# Patient Record
Sex: Male | Born: 1935 | Race: White | Hispanic: No | Marital: Single | Smoking: Never smoker
Health system: Southern US, Community
[De-identification: ages and names within clinical notes are randomized; demographics above are authoritative.]

## PROBLEM LIST (undated history)

## (undated) DIAGNOSIS — I480 Paroxysmal atrial fibrillation: Secondary | ICD-10-CM

## (undated) DIAGNOSIS — R9439 Abnormal result of other cardiovascular function study: Secondary | ICD-10-CM

## (undated) DIAGNOSIS — I5042 Chronic combined systolic (congestive) and diastolic (congestive) heart failure: Secondary | ICD-10-CM

## (undated) DIAGNOSIS — N186 End stage renal disease: Secondary | ICD-10-CM

## (undated) DIAGNOSIS — I272 Pulmonary hypertension, unspecified: Secondary | ICD-10-CM

## (undated) DIAGNOSIS — D649 Anemia, unspecified: Secondary | ICD-10-CM

## (undated) DIAGNOSIS — I4892 Unspecified atrial flutter: Secondary | ICD-10-CM

## (undated) DIAGNOSIS — J9610 Chronic respiratory failure, unspecified whether with hypoxia or hypercapnia: Secondary | ICD-10-CM

## (undated) DIAGNOSIS — R55 Syncope and collapse: Secondary | ICD-10-CM

## (undated) DIAGNOSIS — I10 Essential (primary) hypertension: Secondary | ICD-10-CM

## (undated) HISTORY — PX: TONSILLECTOMY: SUR1361

---

## 2015-12-24 DIAGNOSIS — E785 Hyperlipidemia, unspecified: Secondary | ICD-10-CM | POA: Diagnosis present

## 2015-12-24 DIAGNOSIS — I480 Paroxysmal atrial fibrillation: Secondary | ICD-10-CM | POA: Diagnosis present

## 2015-12-24 DIAGNOSIS — N189 Chronic kidney disease, unspecified: Secondary | ICD-10-CM

## 2015-12-24 DIAGNOSIS — N185 Chronic kidney disease, stage 5: Secondary | ICD-10-CM | POA: Diagnosis present

## 2015-12-24 DIAGNOSIS — I10 Essential (primary) hypertension: Secondary | ICD-10-CM | POA: Diagnosis present

## 2015-12-24 DIAGNOSIS — Z87898 Personal history of other specified conditions: Secondary | ICD-10-CM

## 2015-12-24 DIAGNOSIS — D631 Anemia in chronic kidney disease: Secondary | ICD-10-CM | POA: Diagnosis present

## 2016-01-23 ENCOUNTER — Inpatient Hospital Stay (HOSPITAL_COMMUNITY)
Admission: AD | Admit: 2016-01-23 | Discharge: 2016-01-30 | DRG: 291 | Disposition: A | Discharge status: Skilled Nursing Facility | Payer: Medicare Other | Source: Other Acute Inpatient Hospital | Attending: Family Medicine | Admitting: Family Medicine

## 2016-01-23 ENCOUNTER — Encounter (HOSPITAL_COMMUNITY): Payer: Self-pay | Admitting: *Deleted

## 2016-01-23 DIAGNOSIS — N185 Chronic kidney disease, stage 5: Secondary | ICD-10-CM | POA: Diagnosis not present

## 2016-01-23 DIAGNOSIS — G9341 Metabolic encephalopathy: Secondary | ICD-10-CM | POA: Diagnosis not present

## 2016-01-23 DIAGNOSIS — N2581 Secondary hyperparathyroidism of renal origin: Secondary | ICD-10-CM | POA: Diagnosis present

## 2016-01-23 DIAGNOSIS — F0390 Unspecified dementia without behavioral disturbance: Secondary | ICD-10-CM | POA: Diagnosis present

## 2016-01-23 DIAGNOSIS — J9600 Acute respiratory failure, unspecified whether with hypoxia or hypercapnia: Secondary | ICD-10-CM

## 2016-01-23 DIAGNOSIS — I509 Heart failure, unspecified: Secondary | ICD-10-CM | POA: Diagnosis not present

## 2016-01-23 DIAGNOSIS — Z7901 Long term (current) use of anticoagulants: Secondary | ICD-10-CM

## 2016-01-23 DIAGNOSIS — I4892 Unspecified atrial flutter: Secondary | ICD-10-CM | POA: Diagnosis present

## 2016-01-23 DIAGNOSIS — Z87898 Personal history of other specified conditions: Secondary | ICD-10-CM

## 2016-01-23 DIAGNOSIS — E875 Hyperkalemia: Secondary | ICD-10-CM | POA: Diagnosis not present

## 2016-01-23 DIAGNOSIS — Z823 Family history of stroke: Secondary | ICD-10-CM

## 2016-01-23 DIAGNOSIS — N189 Chronic kidney disease, unspecified: Secondary | ICD-10-CM | POA: Diagnosis not present

## 2016-01-23 DIAGNOSIS — I10 Essential (primary) hypertension: Secondary | ICD-10-CM | POA: Diagnosis not present

## 2016-01-23 DIAGNOSIS — I132 Hypertensive heart and chronic kidney disease with heart failure and with stage 5 chronic kidney disease, or end stage renal disease: Secondary | ICD-10-CM | POA: Diagnosis present

## 2016-01-23 DIAGNOSIS — Z992 Dependence on renal dialysis: Secondary | ICD-10-CM | POA: Diagnosis not present

## 2016-01-23 DIAGNOSIS — M25519 Pain in unspecified shoulder: Secondary | ICD-10-CM

## 2016-01-23 DIAGNOSIS — I447 Left bundle-branch block, unspecified: Secondary | ICD-10-CM | POA: Diagnosis not present

## 2016-01-23 DIAGNOSIS — I429 Cardiomyopathy, unspecified: Secondary | ICD-10-CM | POA: Diagnosis present

## 2016-01-23 DIAGNOSIS — J45909 Unspecified asthma, uncomplicated: Secondary | ICD-10-CM | POA: Diagnosis present

## 2016-01-23 DIAGNOSIS — R339 Retention of urine, unspecified: Secondary | ICD-10-CM | POA: Diagnosis not present

## 2016-01-23 DIAGNOSIS — I5022 Chronic systolic (congestive) heart failure: Secondary | ICD-10-CM | POA: Diagnosis not present

## 2016-01-23 DIAGNOSIS — Z8249 Family history of ischemic heart disease and other diseases of the circulatory system: Secondary | ICD-10-CM

## 2016-01-23 DIAGNOSIS — E440 Moderate protein-calorie malnutrition: Secondary | ICD-10-CM | POA: Diagnosis present

## 2016-01-23 DIAGNOSIS — J9601 Acute respiratory failure with hypoxia: Secondary | ICD-10-CM | POA: Diagnosis not present

## 2016-01-23 DIAGNOSIS — I5021 Acute systolic (congestive) heart failure: Secondary | ICD-10-CM

## 2016-01-23 DIAGNOSIS — E785 Hyperlipidemia, unspecified: Secondary | ICD-10-CM | POA: Diagnosis present

## 2016-01-23 DIAGNOSIS — I272 Other secondary pulmonary hypertension: Secondary | ICD-10-CM | POA: Diagnosis not present

## 2016-01-23 DIAGNOSIS — N186 End stage renal disease: Secondary | ICD-10-CM | POA: Diagnosis present

## 2016-01-23 DIAGNOSIS — G4700 Insomnia, unspecified: Secondary | ICD-10-CM | POA: Diagnosis not present

## 2016-01-23 DIAGNOSIS — Z7952 Long term (current) use of systemic steroids: Secondary | ICD-10-CM

## 2016-01-23 DIAGNOSIS — J189 Pneumonia, unspecified organism: Secondary | ICD-10-CM | POA: Diagnosis present

## 2016-01-23 DIAGNOSIS — K5909 Other constipation: Secondary | ICD-10-CM | POA: Diagnosis present

## 2016-01-23 DIAGNOSIS — D631 Anemia in chronic kidney disease: Secondary | ICD-10-CM | POA: Diagnosis not present

## 2016-01-23 DIAGNOSIS — I480 Paroxysmal atrial fibrillation: Secondary | ICD-10-CM | POA: Diagnosis present

## 2016-01-23 DIAGNOSIS — I481 Persistent atrial fibrillation: Secondary | ICD-10-CM | POA: Diagnosis not present

## 2016-01-23 DIAGNOSIS — Z9189 Other specified personal risk factors, not elsewhere classified: Secondary | ICD-10-CM

## 2016-01-23 DIAGNOSIS — I482 Chronic atrial fibrillation: Secondary | ICD-10-CM | POA: Diagnosis not present

## 2016-01-23 DIAGNOSIS — I4891 Unspecified atrial fibrillation: Secondary | ICD-10-CM | POA: Diagnosis present

## 2016-01-23 HISTORY — DX: Abnormal result of other cardiovascular function study: R94.39

## 2016-01-23 HISTORY — DX: Essential (primary) hypertension: I10

## 2016-01-23 HISTORY — DX: Anemia, unspecified: D64.9

## 2016-01-23 HISTORY — DX: Unspecified atrial flutter: I48.92

## 2016-01-23 HISTORY — DX: Syncope and collapse: R55

## 2016-01-23 HISTORY — DX: Chronic respiratory failure, unspecified whether with hypoxia or hypercapnia: J96.10

## 2016-01-23 HISTORY — DX: Chronic combined systolic (congestive) and diastolic (congestive) heart failure: I50.42

## 2016-01-23 HISTORY — DX: Paroxysmal atrial fibrillation: I48.0

## 2016-01-23 HISTORY — DX: End stage renal disease: N18.6

## 2016-01-23 HISTORY — DX: Pulmonary hypertension, unspecified: I27.20

## 2016-01-23 LAB — BLOOD GAS, ARTERIAL
Acid-Base Excess: 0.9 mmol/L (ref 0.0–2.0)
BICARBONATE: 24.9 mmol/L (ref 20.0–28.0)
DRAWN BY: 330991
FIO2: 21
O2 Saturation: 86 %
PATIENT TEMPERATURE: 97.7
PH ART: 7.424 (ref 7.350–7.450)
pCO2 arterial: 38.5 mmHg (ref 32.0–48.0)
pO2, Arterial: 49.3 mmHg — ABNORMAL LOW (ref 83.0–108.0)

## 2016-01-23 LAB — CBC
HEMATOCRIT: 29.3 % — AB (ref 39.0–52.0)
Hemoglobin: 9 g/dL — ABNORMAL LOW (ref 13.0–17.0)
MCH: 31.8 pg (ref 26.0–34.0)
MCHC: 30.7 g/dL (ref 30.0–36.0)
MCV: 103.5 fL — AB (ref 78.0–100.0)
Platelets: 211 10*3/uL (ref 150–400)
RBC: 2.83 MIL/uL — ABNORMAL LOW (ref 4.22–5.81)
RDW: 17.2 % — AB (ref 11.5–15.5)
WBC: 7.3 10*3/uL (ref 4.0–10.5)

## 2016-01-23 LAB — RENAL FUNCTION PANEL
ALBUMIN: 2.6 g/dL — AB (ref 3.5–5.0)
Anion gap: 3 — ABNORMAL LOW (ref 5–15)
BUN: 38 mg/dL — AB (ref 6–20)
CO2: 26 mmol/L (ref 22–32)
CREATININE: 4.04 mg/dL — AB (ref 0.61–1.24)
Calcium: 8.4 mg/dL — ABNORMAL LOW (ref 8.9–10.3)
Chloride: 108 mmol/L (ref 101–111)
GFR calc Af Amer: 14 mL/min — ABNORMAL LOW (ref >60)
GFR calc non Af Amer: 12 mL/min — ABNORMAL LOW (ref >60)
GLUCOSE: 110 mg/dL — AB (ref 65–99)
PHOSPHORUS: 2.4 mg/dL — AB (ref 2.5–4.6)
POTASSIUM: 5 mmol/L (ref 3.5–5.1)
SODIUM: 137 mmol/L (ref 135–145)

## 2016-01-23 LAB — TROPONIN I: TROPONIN I: 0.03 ng/mL — AB (ref <0.03)

## 2016-01-23 MED ORDER — VANCOMYCIN HCL 10 G IV SOLR
1500.0000 mg | Freq: Once | INTRAVENOUS | Status: AC
  Administered 2016-01-24: 1500 mg via INTRAVENOUS
  Filled 2016-01-23: qty 1500

## 2016-01-23 MED ORDER — TAMSULOSIN HCL 0.4 MG PO CAPS
0.4000 mg | ORAL_CAPSULE | Freq: Every day | ORAL | Status: DC
  Administered 2016-01-24 – 2016-01-29 (×7): 0.4 mg via ORAL
  Filled 2016-01-23 (×7): qty 1

## 2016-01-23 MED ORDER — FERROUS SULFATE 325 (65 FE) MG PO TABS
325.0000 mg | ORAL_TABLET | Freq: Every day | ORAL | Status: DC
  Administered 2016-01-24 – 2016-01-30 (×7): 325 mg via ORAL
  Filled 2016-01-23 (×7): qty 1

## 2016-01-23 MED ORDER — IPRATROPIUM-ALBUTEROL 0.5-2.5 (3) MG/3ML IN SOLN
3.0000 mL | Freq: Four times a day (QID) | RESPIRATORY_TRACT | Status: DC
  Administered 2016-01-23 – 2016-01-24 (×4): 3 mL via RESPIRATORY_TRACT
  Filled 2016-01-23 (×4): qty 3

## 2016-01-23 MED ORDER — DEXTROSE 5 % IV SOLN
1.0000 g | INTRAVENOUS | Status: DC
  Administered 2016-01-23: 1 g via INTRAVENOUS
  Filled 2016-01-23 (×2): qty 1

## 2016-01-23 MED ORDER — FLEET ENEMA 7-19 GM/118ML RE ENEM
1.0000 | ENEMA | Freq: Once | RECTAL | Status: DC | PRN
  Filled 2016-01-23: qty 1

## 2016-01-23 MED ORDER — ACETAMINOPHEN 325 MG PO TABS
650.0000 mg | ORAL_TABLET | Freq: Four times a day (QID) | ORAL | Status: DC | PRN
  Administered 2016-01-29: 650 mg via ORAL
  Filled 2016-01-23: qty 2

## 2016-01-23 MED ORDER — VANCOMYCIN HCL IN DEXTROSE 1-5 GM/200ML-% IV SOLN
INTRAVENOUS | Status: AC
  Filled 2016-01-23: qty 200

## 2016-01-23 MED ORDER — VANCOMYCIN HCL IN DEXTROSE 500-5 MG/100ML-% IV SOLN
INTRAVENOUS | Status: AC
  Administered 2016-01-24: 500 mg
  Filled 2016-01-23: qty 100

## 2016-01-23 MED ORDER — DEXTROSE 5 % IV SOLN
1.0000 g | Freq: Three times a day (TID) | INTRAVENOUS | Status: DC
  Filled 2016-01-23 (×3): qty 1

## 2016-01-23 MED ORDER — HEPARIN SODIUM (PORCINE) 1000 UNIT/ML DIALYSIS: 20.0000 [IU]/kg | INTRAMUSCULAR | Status: DC | PRN

## 2016-01-23 MED ORDER — MAGNESIUM HYDROXIDE 400 MG/5ML PO SUSP: 30.0000 mL | Freq: Once | ORAL | Status: DC | PRN

## 2016-01-23 MED ORDER — OXYCODONE HCL 5 MG PO TABS
5.0000 mg | ORAL_TABLET | ORAL | Status: DC | PRN
  Administered 2016-01-25: 5 mg via ORAL
  Filled 2016-01-23: qty 1

## 2016-01-23 MED ORDER — AMIODARONE HCL 200 MG PO TABS
400.0000 mg | ORAL_TABLET | Freq: Every day | ORAL | Status: DC
  Administered 2016-01-24 – 2016-01-26 (×4): 400 mg via ORAL
  Filled 2016-01-23 (×4): qty 2

## 2016-01-23 MED ORDER — MIRTAZAPINE 15 MG PO TABS
30.0000 mg | ORAL_TABLET | Freq: Every day | ORAL | Status: DC
  Administered 2016-01-24 – 2016-01-29 (×7): 30 mg via ORAL
  Filled 2016-01-23 (×7): qty 2

## 2016-01-23 MED ORDER — ATORVASTATIN CALCIUM 10 MG PO TABS
10.0000 mg | ORAL_TABLET | Freq: Every day | ORAL | Status: DC
  Administered 2016-01-23 – 2016-01-29 (×5): 10 mg via ORAL
  Filled 2016-01-23 (×5): qty 1

## 2016-01-23 MED ORDER — APIXABAN 2.5 MG PO TABS
2.5000 mg | ORAL_TABLET | Freq: Two times a day (BID) | ORAL | Status: DC
  Administered 2016-01-24 – 2016-01-30 (×14): 2.5 mg via ORAL
  Filled 2016-01-23 (×14): qty 1

## 2016-01-23 MED ORDER — PROMETHAZINE HCL 25 MG PO TABS
12.5000 mg | ORAL_TABLET | Freq: Four times a day (QID) | ORAL | Status: DC | PRN
  Administered 2016-01-25: 12.5 mg via ORAL
  Filled 2016-01-23: qty 1

## 2016-01-23 NOTE — Consult Note (Signed)
[facility] Renal Consultation Note    Indication for Consultation:  Management of ESRD/hemodialysis; anemia, hypertension/volume and secondary hyperparathyroidism  HPI: Jamie HuxleyDal Stegner is a 82 y.o. male.   Jamie Brock is an elderly WM with PMH sig for paroxysmal A fib/flutter, HTN, CHF, and new ESRD (recently started on HD 01/10/16 [facility]) who was discharged from an outside hospital on 01/21/16 and sent from [facility] after he developed SOB and found to have hypoxia.  He was then [facility] despite [facility] all of [facility] for further evaluation.  We were consulted to help manage his ESRD/hemodialysis as well as renal-related medical conditions.  Of note, he was [facility].  He is a poor historian and did not know the name of his dialysis unit.  Past Medical History:  Diagnosis Date  . CKD (chronic kidney disease) stage 5, GFR less than 15 ml/min (HCC) 12/24/2015  . Hypertension   . Shortness of breath dyspnea    Past Surgical History:  Procedure Laterality Date  . TONSILLECTOMY     Family History:   Family History  Problem Relation Age of Onset  . Stroke Mother   . Heart attack Father   . Suicidality Brother    Social History:  reports that he has never smoked. He has never used smokeless tobacco. He reports that he does not drink alcohol or use drugs. No Known Allergies Prior to Admission medications   Medication Sig Start Date End Date Taking? Authorizing Provider  acetaminophen (TYLENOL) 325 MG tablet Take 650 mg by mouth every 6 (six) hours as needed for fever (pain).   Yes Historical Provider, MD  albuterol (PROVENTIL) (2.5 MG/3ML) 0.083% nebulizer solution Take 2.5 mg by nebulization every 4 (four) hours as needed for shortness of breath.   Yes Historical Provider, MD  amiodarone (PACERONE) 400 MG tablet Take 400 mg by mouth daily.    Yes Historical Provider, MD  apixaban (ELIQUIS) 2.5 MG TABS tablet Take 2.5 mg by mouth 2 (two) times daily.   Yes Historical Provider, MD  atorvastatin (LIPITOR) 10 MG tablet Take 10 mg by mouth daily.   Yes Historical Provider, MD  bisacodyl (DULCOLAX) 10 MG suppository Place 10 mg rectally once as needed (constipation (if no BM from MOM/lax)).   Yes Historical Provider, MD  cyanocobalamin 500 MCG tablet Take 1,000 mcg by mouth daily.   Yes Historical Provider, MD  diphenhydrAMINE (BENADRYL) 25 MG tablet Take 25 mg by mouth every 4 (four) hours as needed for itching, allergies or sleep.   Yes Historical Provider, MD  ferrous sulfate 325 (65 FE) MG tablet Take 325 mg by mouth daily.   Yes Historical Provider, MD  ipratropium-albuterol (DUONEB) 0.5-2.5 (3) MG/3ML SOLN Take 3 mLs by nebulization every 6 (six) hours.   Yes Historical Provider, MD  magnesium hydroxide (MILK OF MAGNESIA) 400 MG/5ML suspension Take 30 mLs by mouth once as needed (constipation (if no BM on day 3)).   Yes Historical Provider, MD  metoprolol tartrate (LOPRESSOR) 25 MG tablet Take 25 mg by mouth 3 (three) times daily. 8am, 2pm, 8pm   Yes Historical Provider, MD  mirtazapine (REMERON) 30 MG tablet Take 30 mg by mouth at bedtime.   Yes Historical Provider, MD  predniSONE (DELTASONE) 20 MG tablet Take 10-20 mg by mouth See admin instructions. Started 01/22/16: take 1 tablet (20 mg) by mouth  daily for 3 days, then take 1/2 tablet (10 mg) daily for 3 days, then stop   Yes Historical Provider, MD  sodium phosphate (FLEET) 7-19 GM/118ML ENEM Place 1 enema rectally once as needed for severe constipation (constipation (if no result from bisacodyl suppository)).   Yes Historical Provider, MD  tamsulosin (FLOMAX) 0.4 MG CAPS capsule Take 0.4 mg by mouth at bedtime.   Yes Historical Provider, MD   No current facility-administered medications for this encounter.    Labs: Basic Metabolic Panel: No results for input(s): NA, K, CL, CO2,  GLUCOSE, BUN, CREATININE, CALCIUM, PHOS in the last 168 hours.  Invalid input(s): ALB Liver Function Tests: No results for input(s): AST, ALT, ALKPHOS, BILITOT, PROT, ALBUMIN in the last 168 hours. No results for input(s): LIPASE, AMYLASE in the last 168 hours. No results for input(s): AMMONIA in the last 168 hours. CBC: No results for input(s): WBC, NEUTROABS, HGB, HCT, MCV, PLT in the last 168 hours. Cardiac Enzymes: No results for input(s): CKTOTAL, CKMB, CKMBINDEX, TROPONINI in the last 168 hours. CBG: No results for input(s): GLUCAP in the last 168 hours. Iron Studies: No results for input(s): IRON, TIBC, TRANSFERRIN, FERRITIN in the last 72 hours. Studies/Results: No results found.  ROS: Review of systems not obtained due to patient factors. Physical Exam: Vitals:   01/23/16 1150  BP: (!) 107/58  Pulse: (!) 113  Resp: 18  Temp: 97.7 F (36.5 C)  TempSrc: Oral  SpO2: 96%      Weight change:  No intake or output data in the 24 hours ending 01/23/16 1420 BP (!) 107/58 (BP Location: Right Arm)   Pulse (!) 113   Temp 97.7 F (36.5 C) (Oral)   Resp 18   SpO2 96%  General appearance: slowed mentation and oriented to person and time only Head: Normocephalic, without obvious abnormality, atraumatic Eyes: negative findings: lids and lashes normal, conjunctivae and sclerae normal and corneas clear Resp: rales bibasilar Cardio: irregularly irregular rhythm and no rub GI: soft, non-tender; bowel sounds normal; no masses,  no organomegaly Extremities: edema minimal pretibial edema Dialysis Access:  Dialysis Orders: [facility]  on [facility] . Primary Nephrologist:  Dr. Lois Brock  Assessment/Plan: 1.  Hypoxia- with some pulmonary edema vs. Pna.  Plan for HD with UF today and follow clinically 2.  ESRD -  Normally TTS but new start to HD 3.  Hypertension/volume  - some volume overload as above, will UF and follow 4.  Anemia  - will follow H/H and start  ESA 5.  Metabolic bone disease -  Await calcium and phos levels 6.  Nutrition - renal diet 7. Hyperkalemia- K of 5.6 at [facility] will recheck and plan for HD today. 8. A fib- on amiodarone and dilt for rate control as well as Eliquis, per primary  Irena Cords, MD [facility], [facility] Pager [phone] 01/23/2016, 2:20 PM

## 2016-01-23 NOTE — Progress Notes (Signed)
Pharmacy Antibiotic Note Lois HuxleyDal Barberi is a 82 y.o. male admitted on 01/23/2016 with concern for  pneumonia in setting of ESRD (normally TTS).  Pharmacy has been consulted for vancomycin and cefepime dosing.  Plan: 1. Vancomycin 1500 mg x 1 now, follow up IHD schedule and doses further doses as needed  2. Cefepime 1 gram IV every 24 hours for now   Temp (24hrs), Avg:97.8 F (36.6 C), Min:97.7 F (36.5 C), Max:97.8 F (36.6 C)  No results for input(s): WBC, CREATININE, LATICACIDVEN, VANCOTROUGH, VANCOPEAK, VANCORANDOM, GENTTROUGH, GENTPEAK, GENTRANDOM, TOBRATROUGH, TOBRAPEAK, TOBRARND, AMIKACINPEAK, AMIKACINTROU, AMIKACIN in the last 168 hours.  CrCl cannot be calculated (Unknown ideal weight.).    No Known Allergies  Antimicrobials this admission: 9/4 Cefepime >>  9/4 vancomycin  >>   Dose adjustments this admission: n/a  Microbiology results: 9/4 BCx:  9/4 UCx:     Thank you for allowing pharmacy to be a part of this patient's care.  Pollyann SamplesAndy Emarion Toral, PharmD, BCPS 01/23/2016, 5:47 PM Pager: [phone]

## 2016-01-23 NOTE — Progress Notes (Signed)
CRITICAL LAB VALUE: TROPONIN: 0.03 On call notified.  Veatrice KellsMahmoud,Amiera Herzberg I, RN

## 2016-01-23 NOTE — H&P (Addendum)
History and Physical    Jamie HuxleyDal Matos WUJ:[id number] DOB: Apr 04, 1930 DOA: 01/23/2016  PCP: No primary care provider on file. [facility]  Chief Complaint: AMS and cough  HPI: Jamie Brock is a 82 y.o. male with medical history significant of ESRD, HTN, A. fib, constipation.  Level V caveat applies this patient is unable to provide any reliable history at this point time. Difficult to ascertain whether not this is patient's baseline. [facility]. Information obtained by [facility]. Patient was recently started on hemodialysis, 01/10/2016, [facility]. He was discharged [facility] on 01/21/2016. On 01/23/2016 nursing home staff noted that patient was small-to-moderate and short of breath. EMS arrived and noted in oxygen l saturation level of around 80%. The reported home O2 need. No other focal abnormality reported and no complaints such as fevers, chest pain, shortness of breath. When asked to describe symptoms patient is only able to endorse having a "cold" for several weeks. Patient has no clue as to when his last dialysis session was. Workup around [facility] and fluid overload in a dialysis patient. No dialysis capability around [facility] so patient was accepted to [facility].  ED Course: Pt admitted [facility] the [facility]  Review of Systems: As per HPI otherwise 10 point review of systems negative.   Ambulatory Status: unknown  Past Medical History:  Diagnosis Date  . CKD (chronic kidney disease) stage 5, GFR less than 15 ml/min (HCC) 12/24/2015  . Hypertension   . Shortness of breath dyspnea     Past Surgical History:  Procedure Laterality Date  . TONSILLECTOMY      Social History   Social History  . Marital status: Single    Spouse name: N/A  . Number of children: N/A    . Years of education: N/A   Occupational History  . Not on file.   Social History Main Topics  . Smoking status: Never Smoker  . Smokeless tobacco: Never Used  . Alcohol use No  . Drug use: No  . Sexual activity: No   Other Topics Concern  . Not on file   Social History Narrative  . No narrative on file    No Known Allergies  Family History  Problem Relation Age of Onset  . Stroke Mother   . Heart attack Father   . Suicidality Brother     Prior to Admission medications   Medication Sig Start Date End Date Taking? Authorizing Provider  acetaminophen (TYLENOL) 325 MG tablet Take 650 mg by mouth every 6 (six) hours as needed for fever (pain).   Yes Historical Provider, MD  albuterol (PROVENTIL) (2.5 MG/3ML) 0.083% nebulizer solution Take 2.5 mg by nebulization every 4 (four) hours as needed for shortness of breath.   Yes Historical Provider, MD  amiodarone (PACERONE) 400 MG tablet Take 400 mg by mouth daily.   Yes Historical Provider, MD  apixaban (ELIQUIS) 2.5 MG TABS tablet Take 2.5 mg by mouth 2 (two) times daily.   Yes Historical Provider, MD  atorvastatin (LIPITOR) 10 MG tablet Take 10 mg by mouth daily.   Yes Historical Provider, MD  bisacodyl (DULCOLAX) 10 MG suppository Place 10 mg rectally once as needed (constipation (if no BM from MOM/lax)).   Yes Historical Provider, MD  cyanocobalamin 500 MCG tablet Take 1,000 mcg by mouth daily.  Yes Historical Provider, MD  diphenhydrAMINE (BENADRYL) 25 MG tablet Take 25 mg by mouth every 4 (four) hours as needed for itching, allergies or sleep.   Yes Historical Provider, MD  ferrous sulfate 325 (65 FE) MG tablet Take 325 mg by mouth daily.   Yes Historical Provider, MD  ipratropium-albuterol (DUONEB) 0.5-2.5 (3) MG/3ML SOLN Take 3 mLs by nebulization every 6 (six) hours.   Yes Historical Provider, MD  magnesium hydroxide (MILK OF MAGNESIA) 400 MG/5ML suspension Take 30 mLs by mouth once as needed (constipation (if no BM on day  3)).   Yes Historical Provider, MD  metoprolol tartrate (LOPRESSOR) 25 MG tablet Take 25 mg by mouth 3 (three) times daily. 8am, 2pm, 8pm   Yes Historical Provider, MD  mirtazapine (REMERON) 30 MG tablet Take 30 mg by mouth at bedtime.   Yes Historical Provider, MD  predniSONE (DELTASONE) 20 MG tablet Take 10-20 mg by mouth See admin instructions. Started 01/22/16: take 1 tablet (20 mg) by mouth daily for 3 days, then take 1/2 tablet (10 mg) daily for 3 days, then stop   Yes Historical Provider, MD  sodium phosphate (FLEET) 7-19 GM/118ML ENEM Place 1 enema rectally once as needed for severe constipation (constipation (if no result from bisacodyl suppository)).   Yes Historical Provider, MD  tamsulosin (FLOMAX) 0.4 MG CAPS capsule Take 0.4 mg by mouth at bedtime.   Yes Historical Provider, MD    Physical Exam: Vitals:   01/23/16 1150  BP: (!) 107/58  Pulse: (!) 113  Resp: 18  Temp: 97.7 F (36.5 C)  TempSrc: Oral  SpO2: 96%     General:  Appears calm and comfortable Eyes:  PERRL, EOMI, normal lids, iris ENT: Dry mucous membranes, no teeth Neck:  no LAD, masses or thyromegaly Cardiovascular:  Irregularly irregular, 3/6 systolic murmur, trace lower extremity pitting edema bilaterally Respiratory: Diminished breath sounds in the bases, mild increased effort, crackles bilaterally Abdomen:  soft, ntnd, NABS Skin: Right upper chest wall with debris hemodialysis catheter present. OpSite in place. no rash or induration seen on limited exam Musculoskeletal:  grossly normal tone BUE/BLE, good ROM, no bony abnormality Psychiatric: Follows basic commands. Pleasant. Attempts to answer questions in appropriate manner but patient refused. Neurologic:  CN 2-12 grossly intact, moves all extremities in coordinated fashion, sensation intact  Labs on Admission: I have personally reviewed following labs and imaging studies  CBC: No results for input(s): WBC, NEUTROABS, HGB, HCT, MCV, PLT in the last 168  hours. Basic Metabolic Panel: No results for input(s): NA, K, CL, CO2, GLUCOSE, BUN, CREATININE, CALCIUM, MG, PHOS in the last 168 hours. GFR: CrCl cannot be calculated (Unknown ideal weight.). Liver Function Tests: No results for input(s): AST, ALT, ALKPHOS, BILITOT, PROT, ALBUMIN in the last 168 hours. No results for input(s): LIPASE, AMYLASE in the last 168 hours. No results for input(s): AMMONIA in the last 168 hours. Coagulation Profile: No results for input(s): INR, PROTIME in the last 168 hours. Cardiac Enzymes: No results for input(s): CKTOTAL, CKMB, CKMBINDEX, TROPONINI in the last 168 hours. BNP (last 3 results) No results for input(s): PROBNP in the last 8760 hours. HbA1C: No results for input(s): HGBA1C in the last 72 hours. CBG: No results for input(s): GLUCAP in the last 168 hours. Lipid Profile: No results for input(s): CHOL, HDL, LDLCALC, TRIG, CHOLHDL, LDLDIRECT in the last 72 hours. Thyroid Function Tests: No results for input(s): TSH, T4TOTAL, FREET4, T3FREE, THYROIDAB in the last 72 hours. Anemia Panel: No results  for input(s): VITAMINB12, FOLATE, FERRITIN, TIBC, IRON, RETICCTPCT in the last 72 hours. Urine analysis: No results found for: COLORURINE, APPEARANCEUR, LABSPEC, PHURINE, GLUCOSEU, HGBUR, BILIRUBINUR, KETONESUR, PROTEINUR, UROBILINOGEN, NITRITE, LEUKOCYTESUR  Creatinine Clearance: CrCl cannot be calculated (Unknown ideal weight.).  Sepsis Labs: @LABRCNTIP (procalcitonin:4,lacticidven:4) )No results found for this or any previous visit (from the past 240 hour(s)).   Radiological Exams on Admission: No results found.  Assessment/Plan Active Problems:   Atrial fibrillation (HCC)   CKD (chronic kidney disease) stage 5, GFR less than 15 ml/min (HCC)   Congestive heart failure (CHF) (HCC)   Anemia in chronic kidney disease   Hypertension   Hyperlipidemia   History of syncope   ARF/Hypoxemia: Reported O2 sats of 80% prior to arrival of [facility]. [facility]G at [facility] showing pH 7.4, PCO2 38.5, PCO2 49.3, bicarbonate 24.9. CXR it round of [facility] concerning for pneumonia. No reported COPD her home O2 requirement. Per nursing home chart pt just finished prednisone taper for presumed Asthma exacerbation.  - Vancomycin and cefepime - Dialysis for possible fluid overload - Pneumonia order set utilized - In the auditory O2 sats - Wean O2 as capable - CXR in am  ESRD on dialysis: Patient due to dialysis with initiation on 01/10/2016. Greatly appreciate the assistance of nephrology who have spent a great deal of time teasing through patient's past medical renal history. - Dialysis per nephrology  Acute encephalopathy: Per review of chart does not appear to be uremic and BUN 34. Likely from acute infection, HCAP, and hypoxemia. Unsure of baseline mental status - Treatment as above  Afib/CHF/HTN: No obvious signs of fluid overload or rate instability the patient mildly tachycardic.  - Continue home amiodarone, Eliquis, metop - EKg - Trop - Echo, Strict I/O, Dly Wt, dialysis as above  BPH: - continue flomax  Depression: - continue remeron  HLD:  - continue statin  Constipation: chronic - continue dulcolax, milk of mag, fleet enemas  Anemia: likely from ESRD - Continue supplemental iron - consider EPO -   Asthma: - continue prn albuterol     DVT prophylaxis: eliquis  Code Status: full  Family Communication: none  Disposition Plan: pending improvememtn - anticipate transfer back to SNF, [facility] Consults called: Nephrology  Admission status: inpt    MERRELL, DAVID J MD [facility]  If 7PM-7AM, please contact night-coverage [email or web address] Password [email or web address]  01/23/2016, 4:55 PM

## 2016-01-23 NOTE — Progress Notes (Signed)
Room air ABG collected. Pt placed back on 2LNC. RT will continue to monitor.

## 2016-01-24 ENCOUNTER — Inpatient Hospital Stay (HOSPITAL_COMMUNITY): Payer: Medicare Other

## 2016-01-24 DIAGNOSIS — J189 Pneumonia, unspecified organism: Secondary | ICD-10-CM | POA: Diagnosis present

## 2016-01-24 LAB — BASIC METABOLIC PANEL
ANION GAP: 8 (ref 5–15)
BUN: 13 mg/dL (ref 6–20)
CO2: 27 mmol/L (ref 22–32)
Calcium: 8.1 mg/dL — ABNORMAL LOW (ref 8.9–10.3)
Chloride: 103 mmol/L (ref 101–111)
Creatinine, Ser: 2.08 mg/dL — ABNORMAL HIGH (ref 0.61–1.24)
GFR calc Af Amer: 32 mL/min — ABNORMAL LOW (ref >60)
GFR, EST NON AFRICAN AMERICAN: 28 mL/min — AB (ref >60)
Glucose, Bld: 101 mg/dL — ABNORMAL HIGH (ref 65–99)
POTASSIUM: 3.9 mmol/L (ref 3.5–5.1)
SODIUM: 138 mmol/L (ref 135–145)

## 2016-01-24 LAB — CBC
HEMATOCRIT: 29.3 % — AB (ref 39.0–52.0)
Hemoglobin: 9.1 g/dL — ABNORMAL LOW (ref 13.0–17.0)
MCH: 31.6 pg (ref 26.0–34.0)
MCHC: 31.1 g/dL (ref 30.0–36.0)
MCV: 101.7 fL — ABNORMAL HIGH (ref 78.0–100.0)
PLATELETS: 177 10*3/uL (ref 150–400)
RBC: 2.88 MIL/uL — ABNORMAL LOW (ref 4.22–5.81)
RDW: 17.4 % — AB (ref 11.5–15.5)
WBC: 7.7 10*3/uL (ref 4.0–10.5)

## 2016-01-24 LAB — HEPATITIS B CORE ANTIBODY, TOTAL: Hep B Core Total Ab: NEGATIVE

## 2016-01-24 LAB — HEPATITIS B SURFACE ANTIBODY,QUALITATIVE: Hep B S Ab: NONREACTIVE

## 2016-01-24 LAB — MRSA PCR SCREENING: MRSA BY PCR: NEGATIVE

## 2016-01-24 LAB — HEPATITIS B SURFACE ANTIGEN: HEP B S AG: NEGATIVE

## 2016-01-24 LAB — HIV ANTIBODY (ROUTINE TESTING W REFLEX): HIV Screen 4th Generation wRfx: NONREACTIVE

## 2016-01-24 MED ORDER — VANCOMYCIN HCL IN DEXTROSE 1-5 GM/200ML-% IV SOLN
INTRAVENOUS | Status: AC
  Administered 2016-01-24: 1 g
  Filled 2016-01-24: qty 200

## 2016-01-24 MED ORDER — METOPROLOL TARTRATE 25 MG PO TABS
25.0000 mg | ORAL_TABLET | Freq: Three times a day (TID) | ORAL | Status: DC
  Administered 2016-01-24 – 2016-01-26 (×5): 25 mg via ORAL
  Filled 2016-01-24 (×7): qty 1

## 2016-01-24 MED ORDER — ZOLPIDEM TARTRATE 5 MG PO TABS
5.0000 mg | ORAL_TABLET | Freq: Once | ORAL | Status: AC
  Administered 2016-01-25: 5 mg via ORAL
  Filled 2016-01-24: qty 1

## 2016-01-24 NOTE — Progress Notes (Signed)
Patient ID: Lois HuxleyDal Ivancic, male   DOB: [birth year], 82 y.o.   MRN: [id number]  [facility] Progress Note    Subjective:   A little more awake and alert this am.  More oriented as well   Objective:   BP (!) 106/53 (BP Location: Right Arm)   Pulse (!) 101   Temp 98.7 F (37.1 C) (Oral)   Resp 16   Wt 77 kg (169 lb 12.1 oz)   SpO2 93%   Intake/Output: I/O last 3 completed shifts: In: 360 [P.O.:360] Out: 1200 [Urine:200; Other:1000]   Intake/Output this shift:  No intake/output data recorded. Weight change:   Physical Exam: JXB:JYNWGGen:frail elderly WM in NAd CVS:IRR IRR Resp:bibasilar crackles NFA:OZHYQMAbd:benign Ext: minimal edema bilateral lower extremities  Labs: BMET  Recent Labs Lab 01/23/16 2311 01/24/16 0458  NA 137 138  K 5.0 3.9  CL 108 103  CO2 26 27  GLUCOSE 110* 101*  BUN 38* 13  CREATININE 4.04* 2.08*  ALBUMIN 2.6*  --   CALCIUM 8.4* 8.1*  PHOS 2.4*  --    CBC  Recent Labs Lab 01/23/16 2311 01/24/16 0458  WBC 7.3 7.7  HGB 9.0* 9.1*  HCT 29.3* 29.3*  MCV 103.5* 101.7*  PLT 211 177    @IMGRELPRIORS @ Medications:    . amiodarone  400 mg Oral Daily  . apixaban  2.5 mg Oral BID  . atorvastatin  10 mg Oral q1800  . ceFEPime (MAXIPIME) IV  1 g Intravenous Q24H  . ferrous sulfate  325 mg Oral Daily  . ipratropium-albuterol  3 mL Nebulization Q6H  . mirtazapine  30 mg Oral QHS  . tamsulosin  0.4 mg Oral QHS   Dialysis Orders: [facility] . Primary Nephrologist:  Dr. Lois HuxleySheperd   Assessment/ Plan:    Assessment/Plan: 1.  Hypoxia- with some pulmonary edema vs. HCAP.   1. Antibiotics per primary with assistance of pharmacy. 2. S/p HD with early this am and will follow clinically 2.  ESRD -  New ESRD and on outpatient TTS schedule 1. [facility]. 2. Plan to f/u on TTS schedule there once stable for discharge. 3.  Hypertension/volume  - some volume  overload as above, will UF and follow 4.  Anemia  - will follow H/H and start ESA 5.  Metabolic bone disease -  Await calcium and phos levels 6.  Nutrition - renal diet 7. Hyperkalemia- [facility] and improved with HD earlier today. 8. A fib- on amiodarone and dilt for rate control as well as Eliquis, per primary 9. Disposition- [facility].     Irena CordsJoseph A. Bonnie Roig, MD [facility], [facility] Pager [phone] 01/24/2016, 8:24 AM

## 2016-01-24 NOTE — Progress Notes (Signed)
Chaplain presented to the patient to inquire on his wellbeing, and complete spiritual care consult. The patient states he is doing ok at the time of this visit.  His only request was for a blanket because he states he is cold. Chaplain will follow up as needed. Chaplain Janell QuietAudrey Eathel Pajak [phone]

## 2016-01-24 NOTE — Progress Notes (Signed)
Jamie Brock JXB:[id number] DOB: March 11, 1930 DOA: 01/23/2016 PCP: No primary care provider on file.  Brief narrative:  82 y/o ? [facility] resident P Afib/fluter CHAD2Vasc2~5 on eliquis and controlled with Amiodarone Htn Diastolic CHF presumed as no current echo on file New ESRD since 8/22 on dialysis [name] Found to have hypoxia and transported to [facility] given concerns for HCAP and  Hypoxia from [facility]  Past medical history-As per Problem list Chart reviewed as below-   Consultants:  Nephrology  Procedures:    Antibiotics:  Cefepime 9/4  Vancomycin 9/4    Subjective   Fair Marginal recollection of events leading to hospitiaization Does recall he lives at [facility] doesn't use oxygen at baseline   Objective    Interim History: none  Telemetry: Sinus tach   Objective: Vitals:   01/24/16 0210 01/24/16 0300 01/24/16 0646 01/24/16 0738  BP: (!) 101/56  (!) 106/53   Pulse: (!) 116  (!) 101   Resp:   16   Temp: 98.5 F (36.9 C)  98.7 F (37.1 C)   TempSrc: Oral  Oral   SpO2:   92% 93%  Weight: 77 kg (169 lb 12.1 oz) 77 kg (169 lb 12.1 oz)      Intake/Output Summary (Last 24 hours) at 01/24/16 0740 Last data filed at 01/24/16 0600  Gross per 24 hour  Intake              360 ml  Output             1200 ml  Net             -840 ml    Exam:  General: eomi ncat edentulous with false teeth Cardiovascular:  s1 s2 no m/r/g-tachy, no jvd at bedsdie Respiratory: clinically is clear no added sound Abdomen: soft nt nd no rebound no gaurd Skin intact no le edema Neuro intact-moving 4 limbs equally, no tweisitng to mouth, power 5/5, reflexes grossly intact, sensory grossly intact  Data Reviewed: Basic Metabolic Panel:  Recent Labs Lab 01/23/16 2311 01/24/16 0458  NA 137 138  K 5.0 3.9  CL 108 103  CO2 26 27  GLUCOSE 110* 101*  BUN 38* 13  CREATININE 4.04* 2.08*  CALCIUM 8.4* 8.1*  PHOS 2.4*  --    Liver Function  Tests:  Recent Labs Lab 01/23/16 2311  ALBUMIN 2.6*   No results for input(s): LIPASE, AMYLASE in the last 168 hours. No results for input(s): AMMONIA in the last 168 hours. CBC:  Recent Labs Lab 01/23/16 2311 01/24/16 0458  WBC 7.3 7.7  HGB 9.0* 9.1*  HCT 29.3* 29.3*  MCV 103.5* 101.7*  PLT 211 177   Cardiac Enzymes:  Recent Labs Lab 01/23/16 1811  TROPONINI 0.03*   BNP: Invalid input(s): POCBNP CBG: No results for input(s): GLUCAP in the last 168 hours.  Recent Results (from the past 240 hour(s))  MRSA PCR Screening     Status: None   Collection Time: 01/24/16  3:31 AM  Result Value Ref Range Status   MRSA by PCR NEGATIVE NEGATIVE Final    Comment:        The GeneXpert MRSA Assay (FDA approved for NASAL specimens only), is one component of a comprehensive MRSA colonization surveillance program. It is not intended to diagnose MRSA infection nor to guide or monitor treatment for MRSA infections.      Studies:  All Imaging reviewed and is as per above notation   Scheduled Meds: . amiodarone  400 mg Oral Daily  . apixaban  2.5 mg Oral BID  . atorvastatin  10 mg Oral q1800  . ceFEPime (MAXIPIME) IV  1 g Intravenous Q24H  . ferrous sulfate  325 mg Oral Daily  . ipratropium-albuterol  3 mL Nebulization Q6H  . mirtazapine  30 mg Oral QHS  . tamsulosin  0.4 mg Oral QHS   Continuous Infusions:    Assessment/Plan:  1. Toxic metabolic encephalopathy 2/2 to hypoxia + underlying mild dementia-slowly resolving to apparent basleine 2. Hypoxia, no evidence of PNA by either CXR or fever curve.  D/c Abx.  Hypoxia resolving with dialysis-was a result of suboptimal dialysis.  Unclear indication Steroids-d/c prednisone 3. New Onset HD-per nephology-start on TTS scheduling-defer scheduling and timing to them 4. Atrial fibrillation/flutter, [place] score >5-seems to have some sinus tach-monitor magnesium and phos-re-started home doses of metoprolol 25 tid.  Continue Amiodarone 400 daily with liekly goal to cut back dose to 200 daily in 2-3 days [had been on this dose presumed since 8/22] Adjust as needed in terms of if needing to lower EDW per nephro.  Continue Eliquis 2.5 bid 5. htn see above discussion. 6. Anemia of Renal insufficiency-continue PO ferrous sulphate 325.  Obtain Iron studies and sdjust as needed with IV iron/EPO 7. HLD continue atorvastatin 10 mg daily 8. Urinary retention-cont flomax 0.4 daily-still producing soe urine.  Would proably d/c foley 1-2 days    No family bedside-called number in chart laura brady no answer , left VM Keep inpatient pending adjustments neede dfor dialysis Full code status  Pleas KochJai Twyla Dais, MD  [facility] Pager [phone] 01/24/2016, 7:40 AM    LOS: 1 day

## 2016-01-25 ENCOUNTER — Inpatient Hospital Stay (HOSPITAL_COMMUNITY): Payer: Medicare Other

## 2016-01-25 DIAGNOSIS — I10 Essential (primary) hypertension: Secondary | ICD-10-CM

## 2016-01-25 DIAGNOSIS — Z992 Dependence on renal dialysis: Secondary | ICD-10-CM

## 2016-01-25 DIAGNOSIS — I5022 Chronic systolic (congestive) heart failure: Secondary | ICD-10-CM

## 2016-01-25 DIAGNOSIS — N189 Chronic kidney disease, unspecified: Secondary | ICD-10-CM

## 2016-01-25 DIAGNOSIS — E785 Hyperlipidemia, unspecified: Secondary | ICD-10-CM

## 2016-01-25 DIAGNOSIS — I509 Heart failure, unspecified: Secondary | ICD-10-CM

## 2016-01-25 DIAGNOSIS — N186 End stage renal disease: Secondary | ICD-10-CM

## 2016-01-25 DIAGNOSIS — I482 Chronic atrial fibrillation: Secondary | ICD-10-CM

## 2016-01-25 DIAGNOSIS — D631 Anemia in chronic kidney disease: Secondary | ICD-10-CM

## 2016-01-25 LAB — CBC
HCT: 30.9 % — ABNORMAL LOW (ref 39.0–52.0)
HEMOGLOBIN: 9.4 g/dL — AB (ref 13.0–17.0)
MCH: 31.2 pg (ref 26.0–34.0)
MCHC: 30.4 g/dL (ref 30.0–36.0)
MCV: 102.7 fL — ABNORMAL HIGH (ref 78.0–100.0)
Platelets: 194 10*3/uL (ref 150–400)
RBC: 3.01 MIL/uL — AB (ref 4.22–5.81)
RDW: 16.9 % — ABNORMAL HIGH (ref 11.5–15.5)
WBC: 7.6 10*3/uL (ref 4.0–10.5)

## 2016-01-25 LAB — RENAL FUNCTION PANEL
ANION GAP: 13 (ref 5–15)
Albumin: 2.8 g/dL — ABNORMAL LOW (ref 3.5–5.0)
BUN: 27 mg/dL — ABNORMAL HIGH (ref 6–20)
CALCIUM: 8.2 mg/dL — AB (ref 8.9–10.3)
CO2: 26 mmol/L (ref 22–32)
Chloride: 98 mmol/L — ABNORMAL LOW (ref 101–111)
Creatinine, Ser: 3.11 mg/dL — ABNORMAL HIGH (ref 0.61–1.24)
GFR calc non Af Amer: 17 mL/min — ABNORMAL LOW (ref >60)
GFR, EST AFRICAN AMERICAN: 20 mL/min — AB (ref >60)
Glucose, Bld: 98 mg/dL (ref 65–99)
PHOSPHORUS: 2.7 mg/dL (ref 2.5–4.6)
Potassium: 4.3 mmol/L (ref 3.5–5.1)
SODIUM: 137 mmol/L (ref 135–145)

## 2016-01-25 LAB — ECHOCARDIOGRAM COMPLETE: Weight: 2772.5 oz

## 2016-01-25 LAB — GLUCOSE, CAPILLARY: GLUCOSE-CAPILLARY: 139 mg/dL — AB (ref 65–99)

## 2016-01-25 MED ORDER — IPRATROPIUM-ALBUTEROL 0.5-2.5 (3) MG/3ML IN SOLN
3.0000 mL | Freq: Three times a day (TID) | RESPIRATORY_TRACT | Status: DC
  Administered 2016-01-25 – 2016-01-30 (×12): 3 mL via RESPIRATORY_TRACT
  Filled 2016-01-25 (×13): qty 3

## 2016-01-25 MED ORDER — PERFLUTREN LIPID MICROSPHERE
INTRAVENOUS | Status: AC
  Filled 2016-01-25: qty 10

## 2016-01-25 MED ORDER — PERFLUTREN LIPID MICROSPHERE
1.0000 mL | INTRAVENOUS | Status: AC | PRN
  Administered 2016-01-25: 2 mL via INTRAVENOUS
  Filled 2016-01-25: qty 10

## 2016-01-25 MED ORDER — SODIUM CHLORIDE 0.9 % IV BOLUS (SEPSIS)
250.0000 mL | Freq: Once | INTRAVENOUS | Status: AC
  Administered 2016-01-25: 250 mL via INTRAVENOUS

## 2016-01-25 MED ORDER — ALBUTEROL SULFATE (2.5 MG/3ML) 0.083% IN NEBU: 2.5000 mg | INHALATION_SOLUTION | RESPIRATORY_TRACT | Status: DC | PRN

## 2016-01-25 NOTE — Progress Notes (Signed)
[facility]  PROGRESS NOTE  Jamie HuxleyDal Hoffman AVW:[id number] DOB: 12/31/1929 DOA: 01/23/2016 PCP: No primary care provider on file.    Brief HPI:  82 y/o ? [facility] resident P Afib/fluter CHAD2Vasc2~5 on eliquis and controlled with Amiodarone Htn Diastolic CHF presumed as no current echo on file New ESRD since 8/22 on dialysis [name] Found to have hypoxia and [facility] given concerns for HCAP and  Hypoxia from [facility]     Assessment/Plan:    1. Metabolic encephalopathy- secondary to underlying mild dementia, hypoxia from pulmonary edema. Slowly improving. 2. Acute hypoxemic respiratory failure- resolved after hemodialysis, patient presented with O2 sats of 80%, ABG showed pH 7.44, PO2 49.3, chest x-ray showed interstitial edema and small pleural effusions ,no pneumonia. Antibiotics were empirically started and currently discontinued as no evidence of pneumonia. 3. ESRD on hemodialysis- followed by nephrology, started on Tuesday Thursday and Saturday schedule. 4. Atrial fibrillation/flutter- CHA2DS2VASc score is 5, continue metoprolol 25 mg 3 times a day., Amiodarone, Apixaban. Heart rate is controlled. 5. Hyperlipidemia- continue atorvastatin 10 mg by mouth daily 6. Urinary retention- continue Flomax 0.4 mg daily   DVT prophylaxis: Apixaban Code Status: Full code Family Communication: No family at bedside  Disposition Plan: Skilled  facility   Consultants:  None   Procedures:  None   Antibiotics:  Cefepime 9/4- 9/5  Vancomycin 9/4- 9/5  Subjective   Patient seen and examined, breathing  has improved with hemodialysis.  Objective    Objective: Vitals:   01/24/16 2100 01/24/16 2124 01/25/16 0506 01/25/16 0926  BP: (!) 94/59  (!) 94/58 95/60  Pulse: (!) 112  (!) 112 (!) 110  Resp: 18  19 (!) 29  Temp: 98.7 F (37.1 C)  98.7 F (37.1 C) 98.6 F (37 C)  TempSrc: Oral  Oral Oral  SpO2: 90% 93% 94% 94%  Weight: 78.6 kg (173 lb 4.5  oz)       Intake/Output Summary (Last 24 hours) at 01/25/16 1418 Last data filed at 01/25/16 1127  Gross per 24 hour  Intake              720 ml  Output              275 ml  Net              445 ml   Filed Weights   01/24/16 0210 01/24/16 0300 01/24/16 2100  Weight: 77 kg (169 lb 12.1 oz) 77 kg (169 lb 12.1 oz) 78.6 kg (173 lb 4.5 oz)    Examination:  General exam: Appears calm and comfortable  Respiratory system: Clear to auscultation. Respiratory effort normal. Cardiovascular system: S1 & S2 heard, RRR. No JVD, murmurs, rubs, gallops or clicks. No pedal edema. Gastrointestinal system: Abdomen is nondistended, soft and nontender. No organomegaly or masses felt. Normal bowel sounds heard. Central nervous system: Alert and oriented. No focal neurological deficits. Extremities: Symmetric 5 x 5 power. Skin: No rashes, lesions or ulcers Psychiatry: Judgement and insight appear normal. Mood & affect appropriate.    Data Reviewed: I have personally reviewed following labs and imaging studies Basic Metabolic Panel:  Recent Labs Lab 01/23/16 2311 01/24/16 0458 01/25/16 0410  NA 137 138 137  K 5.0 3.9 4.3  CL 108 103 98*  CO2 26 27 26   GLUCOSE 110* 101* 98  BUN 38* 13 27*  CREATININE 4.04* 2.08* 3.11*  CALCIUM 8.4* 8.1* 8.2*  PHOS 2.4*  --  2.7   Liver  Function Tests:  Recent Labs Lab 01/23/16 2311 01/25/16 0410  ALBUMIN 2.6* 2.8*   No results for input(s): LIPASE, AMYLASE in the last 168 hours. No results for input(s): AMMONIA in the last 168 hours. CBC:  Recent Labs Lab 01/23/16 2311 01/24/16 0458 01/25/16 0410  WBC 7.3 7.7 7.6  HGB 9.0* 9.1* 9.4*  HCT 29.3* 29.3* 30.9*  MCV 103.5* 101.7* 102.7*  PLT 211 177 194   Cardiac Enzymes:  Recent Labs Lab 01/23/16 1811  TROPONINI 0.03*   BNP (last 3 results) No results for input(s): BNP in the last 8760 hours.  ProBNP (last 3 results) No results for input(s): PROBNP in the last 8760 hours.  CBG: No  results for input(s): GLUCAP in the last 168 hours.  Recent Results (from the past 240 hour(s))  Culture, blood (routine x 2) Call MD if unable to obtain prior to antibiotics being given     Status: None (Preliminary result)   Collection Time: 01/23/16  6:18 PM  Result Value Ref Range Status   Specimen Description BLOOD RIGHT ANTECUBITAL  Final   Special Requests BOTTLES DRAWN AEROBIC AND ANAEROBIC 5CC  Final   Culture NO GROWTH < 24 HOURS  Final   Report Status PENDING  Incomplete  Culture, blood (routine x 2) Call MD if unable to obtain prior to antibiotics being given     Status: None (Preliminary result)   Collection Time: 01/23/16  6:30 PM  Result Value Ref Range Status   Specimen Description BLOOD LEFT HAND  Final   Special Requests BOTTLES DRAWN AEROBIC ONLY 5CC  Final   Culture NO GROWTH < 24 HOURS  Final   Report Status PENDING  Incomplete  MRSA PCR Screening     Status: None   Collection Time: 01/24/16  3:31 AM  Result Value Ref Range Status   MRSA by PCR NEGATIVE NEGATIVE Final    Comment:        The GeneXpert MRSA Assay (FDA approved for NASAL specimens only), is one component of a comprehensive MRSA colonization surveillance program. It is not intended to diagnose MRSA infection nor to guide or monitor treatment for MRSA infections.      Studies: Dg Chest 1 View  Result Date: 01/24/2016 CLINICAL DATA:  Acute respiratory failure, onset of nausea and vomiting this morning. History of atrial fibrillation, CHF, chronic renal insufficiency on dialysis EXAM: CHEST 1 VIEW COMPARISON:  Portable chest x-ray of January 23, 2016 FINDINGS: The lungs are well-expanded. The interstitial markings remain increased. There small bilateral pleural effusions. The cardiac silhouette remains enlarged. The pulmonary vascularity remains engorged. There is calcification in the wall of the aortic arch. The dual-lumen dialysis catheter tip projects at the cavoatrial junction. IMPRESSION:  Fairly stable appearance of the chest consistent with interstitial edema and small pleural effusions likely secondary to CHF. No discrete pneumonia. Aortic atherosclerosis. Electronically Signed   By: David  [place] M.D.   On: 01/24/2016 07:17    Scheduled Meds: . amiodarone  400 mg Oral Daily  . apixaban  2.5 mg Oral BID  . atorvastatin  10 mg Oral q1800  . ferrous sulfate  325 mg Oral Daily  . ipratropium-albuterol  3 mL Nebulization TID  . metoprolol tartrate  25 mg Oral TID  . mirtazapine  30 mg Oral QHS  . tamsulosin  0.4 mg Oral QHS   Continuous Infusions:      Time spent: 25 min    [facility]  [facility] Pager [phone]. If 7PM-7AM,  please contact night-coverage at [email or web address], Office  [phone]  password [email or web address] 01/25/2016, 2:18 PM  LOS: 2 days

## 2016-01-25 NOTE — Progress Notes (Signed)
Patient complained of SOB. RN assessed patient. Patient laying flat in bed upon arrival to patients room. RN sat patient up. RN checked patient's O2 saturation, patient was at 98% on 2L of oxygen via nasal cannula. RN encouraged patient to take deep breathes. RN auscultated to patient's lung sounds. Patient's lung sounds found to be clear and diminished. RN called charge RN, Nancy MarusNikki Murphy, to assess patient and found same results. Patient would fall asleep in the midst of assessment and RN noticed patient's O2 saturation to decrease to low 90's. Patient stated he is able to breathe better since sitting up. RN will continue to monitor patient.  Veatrice KellsMahmoud,Shirleen Mcfaul I, RN

## 2016-01-25 NOTE — Progress Notes (Signed)
  Echocardiogram 2D Echocardiogram with Definity has been performed.  Jamie Brock 01/25/2016, 10:59 AM

## 2016-01-25 NOTE — Progress Notes (Signed)
Waiting to hear from [facility] if patient has been accepted at his dialysis center.  Osborne Cascoadia Key[place]una Graefe LCSWA [phone]

## 2016-01-25 NOTE — [place] FL2 (Signed)
[place] MEDICAID FL2 LEVEL OF CARE SCREENING TOOL     IDENTIFICATION  Patient Name: Jamie Brock Birthdate: May 16, 1930 Sex: male Admission Date (Current Location): 01/23/2016  [facility] and [place] Number:  Jamie Brock (Patient from [place]) [id number] Facility and Address:  [facility] [place], [address], [place], [place] [address]      Provider Number: [id number]  Attending Physician Name and Address:  Jamie Ide, MD  Relative Name and Phone Number:  Jamie Brock - [phone]    Current Level of Care: Hospital Recommended Level of Care: Skilled Nursing Facility [facility] [place], [place]) Prior Approval Number:    Date Approved/Denied:   PASRR Number: [id number] (Eff. 12/07/15)  Discharge Plan: SNF    Current Diagnoses: Patient Active Problem List   Diagnosis Date Noted  . HCAP (healthcare-associated pneumonia) 01/24/2016  . ESRD on dialysis (HCC) 01/23/2016  . ARF (acute respiratory failure) (HCC)   . Atrial fibrillation (HCC) 12/24/2015  . CKD (chronic kidney disease) stage 5, GFR less than 15 ml/min (HCC) 12/24/2015  . Congestive heart failure (CHF) (HCC) 12/24/2015  . Anemia in chronic kidney disease 12/24/2015  . Hypertension 12/24/2015  . Hyperlipidemia 12/24/2015  . History of syncope 12/24/2015    Orientation RESPIRATION BLADDER Height & Weight     Self, Time, Situation, Place  O2 (2 Liters oxygen) Continent Weight: 173 lb 4.5 oz (78.6 kg) Height:     BEHAVIORAL SYMPTOMS/MOOD NEUROLOGICAL BOWEL NUTRITION STATUS      Continent Diet (Renal with 1200 mL fluid restriction)  AMBULATORY STATUS COMMUNICATION OF NEEDS Skin   Limited Assist Verbally Normal                       Personal Care Assistance Level of Assistance  Bathing, Feeding, Dressing Bathing Assistance: Limited assistance Feeding assistance: Independent Dressing Assistance: Limited assistance     Functional Limitations Info  Sight, Hearing, Speech Sight  Info: Adequate Hearing Info: Adequate Speech Info: Adequate    SPECIAL CARE FACTORS FREQUENCY                       Contractures Contractures Info: Not present    Additional Factors Info  Code Status, Allergies Code Status Info: Full Allergies Info: No known allergies           Current Medications (01/25/2016):  This is the current hospital active medication list Current Facility-Administered Medications  Medication Dose Route Frequency Provider Last Rate Last Dose  . acetaminophen (TYLENOL) tablet 650 mg  650 mg Oral Q6H PRN Jamie Rocks, MD      . albuterol (PROVENTIL) (2.5 MG/3ML) 0.083% nebulizer solution 2.5 mg  2.5 mg Nebulization Q4H PRN Jamie Mura, MD      . amiodarone (PACERONE) tablet 400 mg  400 mg Oral Daily Jamie Rocks, MD   400 mg at 01/25/16 1106  . apixaban (ELIQUIS) tablet 2.5 mg  2.5 mg Oral BID Jamie Rocks, MD   2.5 mg at 01/25/16 1106  . atorvastatin (LIPITOR) tablet 10 mg  10 mg Oral q1800 Jamie Rocks, MD   10 mg at 01/24/16 1744  . ferrous sulfate tablet 325 mg  325 mg Oral Daily Jamie Rocks, MD   325 mg at 01/25/16 1107  . ipratropium-albuterol (DUONEB) 0.5-2.5 (3) MG/3ML nebulizer solution 3 mL  3 mL Nebulization TID Jamie Mura, MD   3 mL at 01/25/16 0904  . magnesium hydroxide (MILK OF  MAGNESIA) suspension 30 mL  30 mL Oral Once PRN Jamie Rocksavid J Merrell, MD      . metoprolol tartrate (LOPRESSOR) tablet 25 mg  25 mg Oral TID Jamie MuraJai-Gurmukh Samtani, MD   25 mg at 01/25/16 1107  . mirtazapine (REMERON) tablet 30 mg  30 mg Oral QHS Jamie Rocksavid J Merrell, MD   30 mg at 01/24/16 2124  . oxyCODONE (Oxy IR/ROXICODONE) immediate release tablet 5 mg  5 mg Oral Q4H PRN Jamie Rocksavid J Merrell, MD      . perflutren lipid microspheres (DEFINITY) IV suspension  1-10 mL Intravenous PRN Jamie Rocksavid J Merrell, MD   2 mL at 01/25/16 1044  . promethazine (PHENERGAN) tablet 12.5 mg  12.5 mg Oral Q6H PRN Jamie Rocksavid J Merrell, MD      . sodium phosphate (FLEET) 7-19  GM/118ML enema 1 enema  1 enema Rectal Once PRN Jamie Rocksavid J Merrell, MD      . tamsulosin [facility](FLOMAX) capsule 0.4 mg  0.4 mg Oral QHS Jamie Rocksavid J Merrell, MD   0.4 mg at 01/24/16 2124     Discharge Medications: Please see discharge summary for a list of discharge medications.  Relevant Imaging Results:  Relevant Lab Results:   Additional Information ss#[id number]. [facility] [place]  Jamie Brock, BoydVanessa Brock, [place]

## 2016-01-25 NOTE — Clinical Social Work Note (Signed)
Clinical Social Work Assessment  Patient Details  Name: Lois HuxleyDal Morawski MRN: [id number] Date of Birth: [birth year]  Date of referral:  01/25/16               Reason for consult:  Facility Placement ([facility] [place], [place])                Permission sought to share information with:  Family Supports Permission granted to share information::  Yes, Verbal Permission Granted  Name::     Vella RaringLaura Brady  Agency::     Relationship::  Sister  Contact Information:  [phone]  Housing/Transportation Living arrangements for the past 2 months:  Skilled Nursing Facility ([facility]) Source of Information:  Patient, Other (Comment Required) (Patient's electronic chart) Patient Interpreter Needed:  None Criminal Activity/Legal Involvement Pertinent to Current Situation/Hospitalization:  No - Comment as needed Significant Relationships:  Siblings (Sister Vernona RiegerLaura) Lives with:  Facility Resident (Patient getting ST rehab at SNF. Will return home where he lives alone) Do you feel safe going back to the place where you live?  Yes Need for family participation in patient care:  No (Coment)  Care giving concerns:  Patient in agreement with rehab prior to returning home as he lives alone.   Social Worker assessment / plan:  CSW talked with patient at the bedside regarding discharge planning and recommendation by MD of ST rehab. Mr. Charm BargesButler was sitting up in bed getting ready to eat lunch. Patient presented as pleasant, alert and oriented and was receptive to talking with CSW.   Patient explained that was at [facility] [place] and will return there at d/c from hospital. Mr. Charm BargesButler reported that [place] and added that his sister also lives in [place].  Patient reported that he is divorced and had 2 children: his daughter is deceased and he is estranged from his son who lives with his mother in [place], [place].  Employment status:   Retired Health and safety inspectornsurance information:  Armed forces operational officerMedicare, [place] Recommend[place]:  Not assessed at this time Information / Referral to community resources:  Other (Comment Required) (Information not requested or needed as patient from facility)  Patient/Family's Response to care:  No concerns expressed regarding care during this hospitalization.  Patient/Family's Understanding of and Emotional Response to Diagnosis, Current Treatment, and Prognosis:  Not discussed.  Emotional Assessment Appearance:  Appears stated age Attitude/Demeanor/Rapport:  Other (Appopriate) Affect (typically observed):  Pleasant, Appropriate Orientation:  Oriented to Self, Oriented to Place, Oriented to  Time, Oriented to Situation Alcohol / Substance use:  Tobacco Use, Alcohol Use, Illicit Drugs (Patient reports no tobacco, alcohol or illicit drug use) Psych involvement (Current and /or in the community):  No (Comment)  Discharge Needs  Concerns to be addressed:  No discharge needs identified Readmission within the last 30 days:  No Current discharge risk:  None Barriers to Discharge:  No Barriers Identified   Cristobal GoldmannCrawford, Ranesha Val Bradley, LCSW 01/25/2016, 1:02 PM

## 2016-01-25 NOTE — Progress Notes (Signed)
Patient ID: Jamie HuxleyDal Kirkland, male   DOB: Nov 13, 1929, 82 y.o.   MRN: [id number]  [facility] Progress Note    Subjective:   No new complaints   Objective:   BP 95/60 (BP Location: Left Arm)   Pulse (!) 110   Temp 98.6 F (37 C) (Oral)   Resp (!) 29   Wt 78.6 kg (173 lb 4.5 oz)   SpO2 94%   Intake/Output: I/O last 3 completed shifts: In: 1080 [P.O.:1080] Out: 1475 [Urine:475; Other:1000]   Intake/Output this shift:  Total I/O In: 360 [P.O.:360] Out: 0  Weight change: 1.8 kg (3 lb 15.5 oz)  Physical Exam: NWG:NFAOZGen:Frail elderly WM in NAD HYQ:MVHQICVS:tachy, IRR IRR Resp:bibasilar crackles with poor inspiratory effort ONG:EXBMWUAbd:benign Ext:no edema  Labs: BMET  Recent Labs Lab 01/23/16 2311 01/24/16 0458 01/25/16 0410  NA 137 138 137  K 5.0 3.9 4.3  CL 108 103 98*  CO2 26 27 26   GLUCOSE 110* 101* 98  BUN 38* 13 27*  CREATININE 4.04* 2.08* 3.11*  ALBUMIN 2.6*  --  2.8*  CALCIUM 8.4* 8.1* 8.2*  PHOS 2.4*  --  2.7   CBC  Recent Labs Lab 01/23/16 2311 01/24/16 0458 01/25/16 0410  WBC 7.3 7.7 7.6  HGB 9.0* 9.1* 9.4*  HCT 29.3* 29.3* 30.9*  MCV 103.5* 101.7* 102.7*  PLT 211 177 194    @IMGRELPRIORS @ Medications:    . amiodarone  400 mg Oral Daily  . apixaban  2.5 mg Oral BID  . atorvastatin  10 mg Oral q1800  . ferrous sulfate  325 mg Oral Daily  . ipratropium-albuterol  3 mL Nebulization TID  . metoprolol tartrate  25 mg Oral TID  . mirtazapine  30 mg Oral QHS  . tamsulosin  0.4 mg Oral QHS     Assessment/ Plan:   1. Hypoxia- with some pulmonary edema vs. HCAP.  1. Antibiotics per primary with assistance of pharmacy. 2. Improved after HD on Tuesday morning 2. ESRD- New ESRD and on outpatient TTS schedule 1. [facility] [place] [facility]. 2. Plan to f/u on TTS schedule there once stable for discharge. 3. Hypertension/volume- improved after HD with [facility].  Cont to follow 4. Anemia- will follow H/H and  start ESA 5. Metabolic bone disease- Await calcium and phos levels 6. Nutrition- renal diet 7. Hyperkalemia- [place] and improved with HD earlier today. 8. A fib-on amiodarone and dilt for rate control as well as Eliquis, per primary 9. Disposition- was [facility].  to return when stable   Irena CordsJoseph A. Mia Winthrop, MD [facility], [facility] Pager [phone] 01/25/2016, 11:38 AM

## 2016-01-25 NOTE — Progress Notes (Signed)
[facility], therefore SNF cannot accept patient back until after patient's dialysis on Thursday or Friday.  Osborne Cascoadia Luca Burston LCSWA [phone]

## 2016-01-25 NOTE — Clinical Social Work Placement (Signed)
   CLINICAL SOCIAL WORK PLACEMENT  NOTE  Date:  01/25/2016  Patient Details  Name: Jamie Brock MRN: [id number] Date of Birth: 24-Jan-1930  Clinical Social Work is seeking post-discharge placement for this patient at the Skilled  Nursing Facility ([facility],) level of care (*CSW will initial, date and re-position this form in  chart as items are completed):  No   Patient/family provided with [facility] Clinical Social Work Department's list of facilities offering this level of care within the geographic area requested by the patient (or if unable, by the patient's family).  Yes   Patient/family informed of their freedom to choose among providers that offer the needed level of care, that participate in Medicare, Medicaid or managed care program needed by the patient, have an available bed and are willing to accept the patient.  No   Patient/family informed of [place]'s ownership interest in [facility] and [facility], as well as of the fact that they are under no obligation to receive care at these facilities.  PASRR submitted to EDS on       PASRR number received on       Existing PASRR number confirmed on 01/25/16     FL2 transmitted to all facilities in geographic area requested by pt/family on       FL2 transmitted to all facilities within larger geographic area on 01/25/16 [facility]     Patient informed that his/her managed care company has contracts with or will negotiate with certain facilities, including the following:        No (Patient from a facility and can return there)   Patient/family informed of bed offers received.  Patient chooses bed at  [facility]    Physician recommends and patient chooses bed at      Patient to be transferred [facility]  .  Patient to be transferred to facility by  Ambulance     Patient family notified on  01/25/16 of transfer.  Name of family member notified:        PHYSICIAN Please sign  FL2     Additional Comment:    _______________________________________________ Cristobal Goldmannrawford, Esperansa Sarabia Bradley, LCSW 01/25/2016, 1:09 PM

## 2016-01-26 ENCOUNTER — Encounter (HOSPITAL_COMMUNITY): Payer: Self-pay | Admitting: *Deleted

## 2016-01-26 DIAGNOSIS — I5021 Acute systolic (congestive) heart failure: Secondary | ICD-10-CM

## 2016-01-26 DIAGNOSIS — I4892 Unspecified atrial flutter: Secondary | ICD-10-CM

## 2016-01-26 DIAGNOSIS — I447 Left bundle-branch block, unspecified: Secondary | ICD-10-CM

## 2016-01-26 LAB — RENAL FUNCTION PANEL
ALBUMIN: 2.7 g/dL — AB (ref 3.5–5.0)
ANION GAP: 13 (ref 5–15)
BUN: 40 mg/dL — AB (ref 6–20)
CALCIUM: 8.5 mg/dL — AB (ref 8.9–10.3)
CO2: 22 mmol/L (ref 22–32)
CREATININE: 3.56 mg/dL — AB (ref 0.61–1.24)
Chloride: 105 mmol/L (ref 101–111)
GFR calc Af Amer: 17 mL/min — ABNORMAL LOW (ref >60)
GFR calc non Af Amer: 14 mL/min — ABNORMAL LOW (ref >60)
GLUCOSE: 89 mg/dL (ref 65–99)
PHOSPHORUS: 4.2 mg/dL (ref 2.5–4.6)
Potassium: 4.6 mmol/L (ref 3.5–5.1)
SODIUM: 140 mmol/L (ref 135–145)

## 2016-01-26 LAB — CBC
HEMATOCRIT: 30.7 % — AB (ref 39.0–52.0)
HEMOGLOBIN: 9.7 g/dL — AB (ref 13.0–17.0)
MCH: 31.3 pg (ref 26.0–34.0)
MCHC: 31.6 g/dL (ref 30.0–36.0)
MCV: 99 fL (ref 78.0–100.0)
Platelets: 158 10*3/uL (ref 150–400)
RBC: 3.1 MIL/uL — ABNORMAL LOW (ref 4.22–5.81)
RDW: 17 % — ABNORMAL HIGH (ref 11.5–15.5)
WBC: 8.1 10*3/uL (ref 4.0–10.5)

## 2016-01-26 MED ORDER — CLONAZEPAM 0.5 MG PO TABS
0.5000 mg | ORAL_TABLET | Freq: Every day | ORAL | Status: DC
  Administered 2016-01-26: 0.5 mg via ORAL
  Filled 2016-01-26: qty 1

## 2016-01-26 MED ORDER — AMIODARONE HCL 200 MG PO TABS
400.0000 mg | ORAL_TABLET | Freq: Two times a day (BID) | ORAL | Status: DC
  Administered 2016-01-26 – 2016-01-29 (×5): 400 mg via ORAL
  Filled 2016-01-26 (×5): qty 2

## 2016-01-26 MED ORDER — HEPARIN SODIUM (PORCINE) 1000 UNIT/ML DIALYSIS: 20.0000 [IU]/kg | INTRAMUSCULAR | Status: DC | PRN

## 2016-01-26 MED ORDER — METOPROLOL TARTRATE 25 MG PO TABS
25.0000 mg | ORAL_TABLET | Freq: Two times a day (BID) | ORAL | Status: DC
  Administered 2016-01-27: 25 mg via ORAL
  Filled 2016-01-26: qty 1

## 2016-01-26 NOTE — Procedures (Signed)
I was present at this dialysis session. I have reviewed the session itself and made appropriate changes. Mr. Jamie Brock can be discharged to SNF today after HD if arrangements can be made.    Filed Weights   01/24/16 0300 01/24/16 2100 01/26/16 0734  Weight: 77 kg (169 lb 12.1 oz) 78.6 kg (173 lb 4.5 oz) 77.5 kg (170 lb 13.7 oz)     Recent Labs Lab 01/26/16 0415  NA 140  K 4.6  CL 105  CO2 22  GLUCOSE 89  BUN 40*  CREATININE 3.56*  CALCIUM 8.5*  PHOS 4.2     Recent Labs Lab 01/24/16 0458 01/25/16 0410 01/26/16 0416  WBC 7.7 7.6 8.1  HGB 9.1* 9.4* 9.7*  HCT 29.3* 30.9* 30.7*  MCV 101.7* 102.7* 99.0  PLT 177 194 158    Scheduled Meds: . amiodarone  400 mg Oral Daily  . apixaban  2.5 mg Oral BID  . atorvastatin  10 mg Oral q1800  . ferrous sulfate  325 mg Oral Daily  . ipratropium-albuterol  3 mL Nebulization TID  . metoprolol tartrate  25 mg Oral TID  . mirtazapine  30 mg Oral QHS  . tamsulosin  0.4 mg Oral QHS   Continuous Infusions:  PRN Meds:.acetaminophen, albuterol, heparin, magnesium hydroxide, oxyCODONE, promethazine, sodium phosphate   Jamie CordsJoseph A Yida Hyams,  MD 01/26/2016, 8:57 AM

## 2016-01-26 NOTE — Consult Note (Signed)
\   Cardiology Consultation Note    Patient ID: Jamie Brock, MRN: [id number], DOB/AGE: 1930-05-20 82 y.o. Admit date: 01/23/2016   Date of Consult: 01/26/2016 Primary Physician: No primary care provider on file. Primary Cardiologist: Dr. Zena Amos  Chief Complaint: low oxygen level Reason for Consultation: atrial flutter, also noted to have new LV dysfunction Requesting MD: Dr. Sharl Ma  HPI: Ricardo Schubach is a 82 y.o. male with history of HTN, recent progression to ESRD on HD, recent anemia, recently diagnosed PAF/flutter, LBBB, previously deemed diastolic CHF, syncope and abnormal stress test whom we are asked to see to review amiodarone in the setting of his afib/aflutter.  The patient is a poor historian and cannot really relay the details of his recent hospitalizations (admitted at least twice in July and once in August). Reviewed notes from OSH in chart. Do not have complete hospital records or discharge summaries, but some of the following information was helpful:  - Cardiology consult note (Dr. Zena Amos) 12/03/15 reports pt had syncope felt due to dehydration/hypotension (BP 70s) with diuretic therapy used to treat HTN along with AF RVR and reduced cardiac output. He was diagnosed with new onset AF at that time with spontaneous conversion to NSR and per their notes was placed on amiodarone (200mg  BID) - Cardiology consult note (Dr. Tiburcio Pea) 12/18/15 indicates acute diastolic CHF and AF RVR with spont conversion to NSR - note indicates he was on 400mg  BID since recent discharge so 200mg  daily for maintenance was recommended. Also reports "recent echo reveals normal LVEF." "Abnormal nuclear stress test last admission. Agree with medical therapy and do not feel invasive coronary angiography necessary." [mild-mod amount of lateral wall ischemia, relegated to medical therapy given risk of CIN and out of concern for DAPT in the setting of anemia if PCI was needed]. Trops 0.06 & 0.07.  Hgb 8-10 range. Had been  admitted with acute hypoxic resp failure. Notes indicate he had been on 2-3 L of O2 at home. - H/P note 12/24/15 reports "he had an echo during a July 2017 admission which revealed diastolic dysfunction and an EF of 50-55%" - EKG note 01/06/16 indicated LBBB, atrial flutter - Renal note 01/09/16 indicates progression from CKD to ESRD requiring HD.   The patient reports he is feeling "good" today. Still endorses some SOB but denies chest pain. SOB is much better than when he was admitted. No recent syncope otherwise. Not very forthcoming with any further details. Denies any recent bleeding. Thinks it is October or November 7th.  He has been cared for recently at [facility] in [place]. On 01/23/16 he was brought to [facility] ED for worsening SOB and acute on chronic respiratory failure. He reported having URI sx but otherwise nothing focal, couldn't really give a good history. He was admitted by IM for metabolic encephalopathy secondary to underlying dementia and hypoxia from pulmonary edema and a/c respiratory failure with initial empiric abx coverage. His respiratory failure improved after hemodialysis. He came in on amiodarone 400mg  daily. He has been in what appears to be atrial flutter with variable conduction versus coarse afib at times, ever since admission. CXR suggestive of CHF. 2D echo 01/25/16; mild focal basal septal hypertrophy of septum, EF 25-30%, akinesis of entire inferolateral, inferior and inferoseptal myocardium, akinesis of the apical anterior and apical myocardium, severe hypokinesis of the entirelateral myocardium, akinesis of the basal-midanteroseptal myocardium, mild-mod MR, mod dilated RV, rounded density in RA that could represent a catheter, PASP .  Past Medical History:  Diagnosis Date  . Abnormal stress test   . Anemia, unspecified   . Atrial flutter (HCC)   . Chronic diastolic CHF (congestive heart failure) (HCC)   . Chronic respiratory failure (HCC)   . ESRD  (end stage renal disease) (HCC)    a. progressed from CKD->ESRD 12/2015.  Marland Kitchen Hypertension   . PAF (paroxysmal atrial fibrillation) (HCC)   . Syncope    a. 11/2015 syncope felt due to dehydration/hypotension (BP 70s) with diuretic therapy used to treat HTN along with AF RVR and reduced cardiac output.       Surgical History:  Past Surgical History:  Procedure Laterality Date  . TONSILLECTOMY       Home Meds: Prior to Admission medications   Medication Sig Start Date End Date Taking? Authorizing Provider  acetaminophen (TYLENOL) 325 MG tablet Take 650 mg by mouth every 6 (six) hours as needed for fever (pain).   Yes Historical Provider, MD  amiodarone (PACERONE) 400 MG tablet Take 400 mg by mouth daily.   Yes Historical Provider, MD  apixaban (ELIQUIS) 2.5 MG TABS tablet Take 2.5 mg by mouth 2 (two) times daily.   Yes Historical Provider, MD  atorvastatin (LIPITOR) 10 MG tablet Take 10 mg by mouth daily.   Yes Historical Provider, MD  ferrous sulfate 325 (65 FE) MG tablet Take 325 mg by mouth daily.   Yes Historical Provider, MD  ipratropium-albuterol (DUONEB) 0.5-2.5 (3) MG/3ML SOLN Take 3 mLs by nebulization every 6 (six) hours.   Yes Historical Provider, MD  magnesium hydroxide (MILK OF MAGNESIA) 400 MG/5ML suspension Take 30 mLs by mouth once as needed (constipation (if no BM on day 3)).   Yes Historical Provider, MD  metoprolol tartrate (LOPRESSOR) 25 MG tablet Take 25 mg by mouth 3 (three) times daily. 8am, 2pm, 8pm   Yes Historical Provider, MD  mirtazapine (REMERON) 30 MG tablet Take 30 mg by mouth at bedtime.   Yes Historical Provider, MD  sodium phosphate (FLEET) 7-19 GM/118ML ENEM Place 1 enema rectally once as needed for severe constipation (constipation (if no result from bisacodyl suppository)).   Yes Historical Provider, MD  tamsulosin (FLOMAX) 0.4 MG CAPS capsule Take 0.4 mg by mouth at bedtime.   Yes Historical Provider, MD    Inpatient Medications:  . amiodarone  400 mg  Oral Daily  . apixaban  2.5 mg Oral BID  . atorvastatin  10 mg Oral q1800  . ferrous sulfate  325 mg Oral Daily  . ipratropium-albuterol  3 mL Nebulization TID  . metoprolol tartrate  25 mg Oral TID  . mirtazapine  30 mg Oral QHS  . tamsulosin  0.4 mg Oral QHS      Allergies: No Known Allergies  Social History   Social History  . Marital status: Single    Spouse name: N/A  . Number of children: N/A  . Years of education: N/A   Occupational History  . Not on file.   Social History Main Topics  . Smoking status: Never Smoker  . Smokeless tobacco: Never Used  . Alcohol use No  . Drug use: No  . Sexual activity: No   Other Topics Concern  . Not on file   Social History Narrative  . No narrative on file     Family History  Problem Relation Age of Onset  . Stroke Mother   . Heart attack Father   . Suicidality Brother      Review of Systems:All other systems reviewed and  are otherwise negative except as noted above - limited given suspected dementia.  Labs:  Recent Labs  01/23/16 1811  TROPONINI 0.03*   Lab Results  Component Value Date   WBC 8.1 01/26/2016   HGB 9.7 (L) 01/26/2016   HCT 30.7 (L) 01/26/2016   MCV 99.0 01/26/2016   PLT 158 01/26/2016    Recent Labs Lab 01/26/16 0415  NA 140  K 4.6  CL 105  CO2 22  BUN 40*  CREATININE 3.56*  CALCIUM 8.5*  GLUCOSE 89   Radiology/Studies:  Dg Chest 1 View  Result Date: 01/24/2016 CLINICAL DATA:  Acute respiratory failure, onset of nausea and vomiting this morning. History of atrial fibrillation, CHF, chronic renal insufficiency on dialysis EXAM: CHEST 1 VIEW COMPARISON:  Portable chest x-ray of January 23, 2016 FINDINGS: The lungs are well-expanded. The interstitial markings remain increased. There small bilateral pleural effusions. The cardiac silhouette remains enlarged. The pulmonary vascularity remains engorged. There is calcification in the wall of the aortic arch. The dual-lumen dialysis  catheter tip projects at the cavoatrial junction. IMPRESSION: Fairly stable appearance of the chest consistent with interstitial edema and small pleural effusions likely secondary to CHF. No discrete pneumonia. Aortic atherosclerosis. Electronically Signed   By: David  Swaz[place]and M.D.   On: 01/24/2016 07:17    Wt Readings from Last 3 Encounters:  01/26/16 166 lb 7.2 oz (75.5 kg)    EKG: Sinus tach vs ectopic atrial tach vs atrial flutter 115bpm, LBBB, baseline tremor  Physical Exam: Blood pressure 111/62, pulse 89, temperature 97.6 F (36.4 C), temperature source Oral, resp. rate 20, weight 166 lb 7.2 oz (75.5 kg), SpO2 96 %. There is no height or weight on file to calculate BMI. General: Well developed pale WM in no acute distress. Head: Normocephalic, atraumatic, sclera non-icteric, no xanthomas, nares are without discharge.  Neck: JVD not elevated. Lungs: Coarse BS bilaterally.  Heart: Irregular, controlled rate, with S1 S2. No murmurs, rubs, or gallops appreciated. Abdomen: Soft, non-tender, non-distended with normoactive bowel sounds. No hepatomegaly. No rebound/guarding. No obvious abdominal masses. Msk:  Strength and tone appear normal for age. Extremities: No clubbing or cyanosis. No edema.  Distal pedal pulses are 2+ and equal bilaterally. Neuro: Alert and oriented to place, self, slightly off on the date. No facial asymmetry. No focal deficit. Moves all extremities spontaneously. Psych:  Responds to questions appropriately with a normal affect.     Assessment and Plan  66M with HTN, recent progression to ESRD on HD 12/2015, recent anemia, recently diagnosed PAF/flutter (had previously spontaneously converted during at least 2 hospital admissions over the summer), LBBB, previously deemed diastolic CHF, syncope and abnormal stress test whom we are asked to see to review amiodarone in the setting of his afib/aflutter. Cardiac workup also notable for new EF 25-30% with multiple wall motion  abnormalities, mild-mod MR, mod pulm HTN (prev reported as normal in 11/2015).  1. Acute on chronic respiratory failure suspected due to pulm edema in setting of ESRD with possible contribution from acute systolic CHF - improved volume status s/p dialysis. Cardiomyopathy appears new from 11/2015. Etiology unclear - could be r/t underlying CAD given abnormal nuclear stress test but could also be tachycardia-mediated. BP too low for ACEI/ARB/spiro. Would prefer to keep metoprolol rather than switching to carvedilol for its rate controlling properties without decreasing BP further. No acute indication to proceed with LHC at present time. He was already recommended for medical therapy for his abnormal nuc but this was before he  went on dialysis. Would recommend he f/u with primary cardiologist Dr. Zena AmosVassallo to further explore.  2. Persistent atrial flutter/fib - per notes has had paroxysms ever since this summer. BP prohibits aggressive AVN. Will review plan for amiodarone with MD. Rates presently in the 80s.  3. Borderline BP with h/o syncope felt related to hypotension 11/2015 - limiting med titration. Follow. ? Role of midodrine.  4. Chronic anemia - per IM. Reported values of 8-10 over the summer so currently stable. Denies bleeding.  Will send a copy of this consult note upon completion to primary cardiologist.  Signed, Laurann Montanaayna N Dunn PA-C 01/26/2016, 2:31 PM Pager: [phone]  Pt seen and examined  Agree with findings as noted above  Pt is a frail 82 yo with multiple medical problems  Usually followed in [place] Pt with ESRD  Known afib  With RVR at times [facility]  Plan for medical Rx  Last echo there LVEF was normal Presents her with hypoxia and afib with RVR Has now improved some with dialysis   HR still is very labile 80s to 140s   ON exam, pt is awake  Somewhat confused Denies CP BP is marginal at timesHR as noted  Lungs with diffuse rales  Cardiac  Irreg irreg  No S3  Ext with tr edema  EKG with known LBBB Echo now with LVEF that is severely depressed   Severe LV dysfunction may reflect tachycardia induced cardiomyopathy  I am not convinced of active ischemia  I would recomm continued amiodarone load  Increased to 400 bid here then decreae to 400 per day as outpt  Try to pull rates down  Continue b blocker  WOuld decrease to bid  I do not think it is helping that much with HR  Continue Eliquis   I would not recomm any invasive evaluation here  Pt cn be followed up by primary cardiologist with poss repeat echo in future to reeval LVEF once heart rates improve.    Dietrich PatesPaula Alayzia Pavlock

## 2016-01-26 NOTE — Progress Notes (Signed)
[facility]  PROGRESS NOTE  Jamie Brock OZH:[id number] DOB: Jun 28, 1929 DOA: 01/23/2016 PCP: No primary care provider on file.    Brief HPI:  82 y/o ? [facility] resident P Afib/fluter CHAD2Vasc2~5 on eliquis and controlled with Amiodarone Htn Diastolic CHF presumed as no current echo on file New ESRD since 8/22 on dialysis [name] Found to have hypoxia and transported to [facility] given concerns for HCAP and  Hypoxia from [facility]     Assessment/Plan:    1. Metabolic encephalopathy- secondary to underlying mild dementia, hypoxia from pulmonary edema. Slowly improving. 2. Acute hypoxemic respiratory failure- resolved after hemodialysis, patient presented with O2 sats of 80%, ABG showed pH 7.44, PO2 49.3, chest x-ray showed interstitial edema and small pleural effusions ,no pneumonia. Antibiotics were empirically started and currently discontinued as no evidence of pneumonia. 3. ESRD on hemodialysis- followed by nephrology, started on Tuesday Thursday and Saturday schedule. 4. Atrial fibrillation/flutter- CHA2DS2VASc score is 5, continue metoprolol 25 mg 3 times a day., Amiodarone, Apixaban. Heart rate is controlled. Will consult cardiology for adjustment of medications. 5. Insomnia- patient has been having difficulty sleeping at night for past two nights, start Clonazepam 0.5 mg po q hs. 6. Hyperlipidemia- continue atorvastatin 10 mg by mouth daily 7. Urinary retention- continue Flomax 0.4 mg daily   DVT prophylaxis: Apixaban Code Status: Full code Family Communication: No family at bedside  Disposition Plan: Skilled  facility   Consultants:  None   Procedures:  None   Antibiotics:  Cefepime 9/4- 9/5  Vancomycin 9/4- 9/5  Subjective   Patient seen and examined, says he did not sleep well at night. Denies chest pain or shortness of breath.  Objective    Objective: Vitals:   01/26/16 1100 01/26/16 1130 01/26/16 1138 01/26/16 1325  BP: (!) 101/51 (!)  99/56 (!) 102/58 111/62  Pulse: 90 88 89   Resp: (!) 21 18 20    Temp:   97.6 F (36.4 C)   TempSrc:   Oral   SpO2: 95% 96% 96%   Weight:   75.5 kg (166 lb 7.2 oz)     Intake/Output Summary (Last 24 hours) at 01/26/16 1515 Last data filed at 01/26/16 1448  Gross per 24 hour  Intake              600 ml  Output             2150 ml  Net            -1550 ml   Filed Weights   01/24/16 2100 01/26/16 0734 01/26/16 1138  Weight: 78.6 kg (173 lb 4.5 oz) 77.5 kg (170 lb 13.7 oz) 75.5 kg (166 lb 7.2 oz)    Examination:  General exam: Appears calm and comfortable  Respiratory system: Clear to auscultation. Respiratory effort normal. Cardiovascular system: S1 & S2 heard, RRR. No JVD, murmurs, rubs, gallops or clicks. No pedal edema. Gastrointestinal system: Abdomen is nondistended, soft and nontender. No organomegaly or masses felt. Normal bowel sounds heard. Central nervous system: Alert and oriented. No focal neurological deficits. Extremities: Symmetric 5 x 5 power. Skin: No rashes, lesions or ulcers Psychiatry: Judgement and insight appear normal. Mood & affect appropriate.    Data Reviewed: I have personally reviewed following labs and imaging studies Basic Metabolic Panel:  Recent Labs Lab 01/23/16 2311 01/24/16 0458 01/25/16 0410 01/26/16 0415  NA 137 138 137 140  K 5.0 3.9 4.3 4.6  CL 108 103 98* 105  CO2 26 27  26 22  GLUCOSE 110* 101* 98 89  BUN 38* 13 27* 40*  CREATININE 4.04* 2.08* 3.11* 3.56*  CALCIUM 8.4* 8.1* 8.2* 8.5*  PHOS 2.4*  --  2.7 4.2   Liver Function Tests:  Recent Labs Lab 01/23/16 2311 01/25/16 0410 01/26/16 0415  ALBUMIN 2.6* 2.8* 2.7*   No results for input(s): LIPASE, AMYLASE in the last 168 hours. No results for input(s): AMMONIA in the last 168 hours. CBC:  Recent Labs Lab 01/23/16 2311 01/24/16 0458 01/25/16 0410 01/26/16 0416  WBC 7.3 7.7 7.6 8.1  HGB 9.0* 9.1* 9.4* 9.7*  HCT 29.3* 29.3* 30.9* 30.7*  MCV 103.5* 101.7* 102.7*  99.0  PLT 211 177 194 158   Cardiac Enzymes:  Recent Labs Lab 01/23/16 1811  TROPONINI 0.03*   BNP (last 3 results) No results for input(s): BNP in the last 8760 hours.  ProBNP (last 3 results) No results for input(s): PROBNP in the last 8760 hours.  CBG:  Recent Labs Lab 01/25/16 1659  GLUCAP 139*    Recent Results (from the past 240 hour(s))  Culture, blood (routine x 2) Call MD if unable to obtain prior to antibiotics being given     Status: None (Preliminary result)   Collection Time: 01/23/16  6:18 PM  Result Value Ref Range Status   Specimen Description BLOOD RIGHT ANTECUBITAL  Final   Special Requests BOTTLES DRAWN AEROBIC AND ANAEROBIC 5CC  Final   Culture NO GROWTH 3 DAYS  Final   Report Status PENDING  Incomplete  Culture, blood (routine x 2) Call MD if unable to obtain prior to antibiotics being given     Status: None (Preliminary result)   Collection Time: 01/23/16  6:30 PM  Result Value Ref Range Status   Specimen Description BLOOD LEFT HAND  Final   Special Requests BOTTLES DRAWN AEROBIC ONLY 5CC  Final   Culture NO GROWTH 3 DAYS  Final   Report Status PENDING  Incomplete  MRSA PCR Screening     Status: None   Collection Time: 01/24/16  3:31 AM  Result Value Ref Range Status   MRSA by PCR NEGATIVE NEGATIVE Final    Comment:        The GeneXpert MRSA Assay (FDA approved for NASAL specimens only), is one component of a comprehensive MRSA colonization surveillance program. It is not intended to diagnose MRSA infection nor to guide or monitor treatment for MRSA infections.      Studies: No results found.  Scheduled Meds: . amiodarone  400 mg Oral Daily  . apixaban  2.5 mg Oral BID  . atorvastatin  10 mg Oral q1800  . ferrous sulfate  325 mg Oral Daily  . ipratropium-albuterol  3 mL Nebulization TID  . metoprolol tartrate  25 mg Oral TID  . mirtazapine  30 mg Oral QHS  . tamsulosin  0.4 mg Oral QHS   Continuous Infusions:      Time  spent: 25 min    [facility] Hospital[facility],Jamie Brock S  [facility] Pager [phone]. If 7PM-7AM, please contact night-coverage at [email or web address], Office  [phone]  password [email or web address] 01/26/2016, 3:15 PM  LOS: 3 days

## 2016-01-26 NOTE — Evaluation (Signed)
Physical Therapy Evaluation Patient Details Name: Lois HuxleyDal Antonetti MRN: [id number] DOB: [birth year] Today's Date: 01/26/2016   History of Present Illness  Patient is an 82 yo male admitted 01/23/16 with AMS, cough, SOB.  Patient with Afib, metabolic encephalopathy, hypoxic resp failure, hypotension.    PMH:  Dementia, ESRD on HD, Afib, HLD  Clinical Impression  Patient presents with problems listed below.  Will benefit from acute PT to maximize functional mobility prior to discharge.  Recommend patient return to SNF for continued therapy at d/c.    Follow Up Recommendations SNF;Supervision/Assistance - 24 hour    Equipment Recommendations  Other (comment) (TBD)    Recommendations for Other Services       Precautions / Restrictions Precautions Precautions: Fall Restrictions Weight Bearing Restrictions: No      Mobility  Bed Mobility Overal bed mobility: Needs Assistance Bed Mobility: Rolling Rolling: Min assist         General bed mobility comments: Verbal cues to use rail to assist with rolling.  Assist to complete roll onto side in both directions.  Encouraged patient to move to sitting - patient declined.  "I can't do anything"  Transfers                 General transfer comment: Patient declined  Ambulation/Gait                Stairs            Wheelchair Mobility    Modified Rankin (Stroke Patients Only)       Balance                                             Pertinent Vitals/Pain Pain Assessment: No/denies pain    Home Living Family/patient expects to be discharged to:: Skilled nursing facility Living Arrangements: Alone               Additional Comments: [facility] and to return there per chart.    Prior Function Level of Independence: Needs assistance   Gait / Transfers Assistance Needed: Patient reports he was working with PT on walking with walker  ADL's / Homemaking Assistance  Needed: Assist with ADL's,        Hand Dominance        Extremity/Trunk Assessment   Upper Extremity Assessment: Generalized weakness           Lower Extremity Assessment: Generalized weakness         Communication   Communication: No difficulties  Cognition Arousal/Alertness: Awake/alert Behavior During Therapy: WFL for tasks assessed/performed;Flat affect Overall Cognitive Status: No family/caregiver present to determine baseline cognitive functioning (Appeared to answer questions appropriately)                      General Comments      Exercises        Assessment/Plan    PT Assessment Patient needs continued PT services  PT Diagnosis Difficulty walking;Generalized weakness   PT Problem List Decreased strength;Decreased activity tolerance;Decreased balance;Decreased mobility;Decreased knowledge of use of DME;Cardiopulmonary status limiting activity  PT Treatment Interventions DME instruction;Gait training;Functional mobility training;Therapeutic activities;Therapeutic exercise;Balance training;Patient/family education   PT Goals (Current goals can be found in the Care Plan section) Acute Rehab PT Goals Patient Stated Goal: None stated PT Goal Formulation: With patient Time For Goal Achievement: 02/09/16  Potential to Achieve Goals: Good    Frequency Min 2X/week   Barriers to discharge        Co-evaluation               End of Session Equipment Utilized During Treatment: Oxygen Activity Tolerance: Patient limited by fatigue Patient left: in bed;with call bell/phone within reach;with bed alarm set           Time: 1610-9604 PT Time Calculation (min) (ACUTE ONLY): 10 min   Charges:   PT Evaluation $PT Eval Moderate Complexity: 1 Procedure     PT G CodesVena Austria 2016-01-28, 3:07 PM Durenda Hurt. Renaldo Fiddler, [facility] Acute Rehab Services Pager [phone]

## 2016-01-26 NOTE — Clinical Social Work Note (Addendum)
Contact made with Wyline BeadyJessica Hamilton, admissions director at [facility] [place] nursing facility [phone] regarding patient being ready for d/c today. CSW informed that the [facility] in [place] cannot accept patient until Tuesday, as he is new to this facility and has to complete admissions paperwork and the personnel that handles admissions paperwork will not be available until Tuesday. Shanda BumpsJessica in agreement that patient can discharge to their facility on Saturday after HD at the hospital.  Contact made with MD and update provided. Patient's clinical information transmitted to SNF.  Genelle BalVanessa Kameryn Davern, MSW, LCSW Licensed Clinical Social Worker Clinical Social Work Department [facility] [phone]

## 2016-01-27 DIAGNOSIS — I5021 Acute systolic (congestive) heart failure: Secondary | ICD-10-CM

## 2016-01-27 DIAGNOSIS — N185 Chronic kidney disease, stage 5: Secondary | ICD-10-CM

## 2016-01-27 DIAGNOSIS — J9600 Acute respiratory failure, unspecified whether with hypoxia or hypercapnia: Secondary | ICD-10-CM

## 2016-01-27 DIAGNOSIS — I480 Paroxysmal atrial fibrillation: Secondary | ICD-10-CM

## 2016-01-27 LAB — BASIC METABOLIC PANEL
Anion gap: 8 (ref 5–15)
BUN: 29 mg/dL — ABNORMAL HIGH (ref 6–20)
CALCIUM: 8.3 mg/dL — AB (ref 8.9–10.3)
CO2: 27 mmol/L (ref 22–32)
CREATININE: 3 mg/dL — AB (ref 0.61–1.24)
Chloride: 103 mmol/L (ref 101–111)
GFR calc Af Amer: 21 mL/min — ABNORMAL LOW (ref >60)
GFR, EST NON AFRICAN AMERICAN: 18 mL/min — AB (ref >60)
GLUCOSE: 93 mg/dL (ref 65–99)
Potassium: 3.6 mmol/L (ref 3.5–5.1)
Sodium: 138 mmol/L (ref 135–145)

## 2016-01-27 LAB — T4, FREE: FREE T4: 1.23 ng/dL — AB (ref 0.61–1.12)

## 2016-01-27 LAB — TSH: TSH: 2.125 u[IU]/mL (ref 0.350–4.500)

## 2016-01-27 MED ORDER — METOPROLOL SUCCINATE ER 50 MG PO TB24
50.0000 mg | ORAL_TABLET | Freq: Every day | ORAL | Status: DC
  Administered 2016-01-28 – 2016-01-29 (×2): 50 mg via ORAL
  Filled 2016-01-27 (×4): qty 1

## 2016-01-27 MED ORDER — CLONAZEPAM 0.5 MG PO TABS
0.2500 mg | ORAL_TABLET | Freq: Every day | ORAL | Status: DC
  Administered 2016-01-27 – 2016-01-29 (×3): 0.25 mg via ORAL
  Filled 2016-01-27 (×3): qty 1

## 2016-01-27 MED ORDER — DARBEPOETIN ALFA 40 MCG/0.4ML IJ SOSY
40.0000 ug | PREFILLED_SYRINGE | INTRAMUSCULAR | Status: DC
  Administered 2016-01-28: 40 ug via INTRAVENOUS
  Filled 2016-01-27: qty 0.4

## 2016-01-27 NOTE — Care Management Important Message (Signed)
Important Message  Patient Details  Name: Jamie Brock MRN: [id number] Date of Birth: 1929/12/06   Medicare Important Message Given:  Yes    [place] 01/27/2016, 10:45 AM

## 2016-01-27 NOTE — Progress Notes (Signed)
Patient ID: Jamie Brock, male   DOB: [birth year], 82 y.o.   MRN: [id number]  [facility] Progress Note    Subjective:   No new complaints   Objective:   BP (!) 106/56 (BP Location: Left Arm)   Pulse (!) 104   Temp 98.3 F (36.8 C) (Oral)   Resp 17   Ht 6' (1.829 m)   Wt 75.5 kg (166 lb 6.4 oz)   SpO2 93%   BMI 22.57 kg/m   Intake/Output: I/O last 3 completed shifts: In: 840 [P.O.:840] Out: 2475 [Urine:475; Other:2000]   Intake/Output this shift:  Total I/O In: 0  Out: 75 [Urine:75] Weight change:   Physical Exam: WUJ:WJXBJGen:Frail elderly, chronically ill-appearing CVS:IRR IRR Resp:poor inspiratory effort YNW:GNFAOZAbd:benign Ext:no edema  Labs: BMET  Recent Labs Lab 01/23/16 2311 01/24/16 0458 01/25/16 0410 01/26/16 0415 01/27/16 0429  NA 137 138 137 140 138  K 5.0 3.9 4.3 4.6 3.6  CL 108 103 98* 105 103  CO2 26 27 26 22 27   GLUCOSE 110* 101* 98 89 93  BUN 38* 13 27* 40* 29*  CREATININE 4.04* 2.08* 3.11* 3.56* 3.00*  ALBUMIN 2.6*  --  2.8* 2.7*  --   CALCIUM 8.4* 8.1* 8.2* 8.5* 8.3*  PHOS 2.4*  --  2.7 4.2  --    CBC  Recent Labs Lab 01/23/16 2311 01/24/16 0458 01/25/16 0410 01/26/16 0416  WBC 7.3 7.7 7.6 8.1  HGB 9.0* 9.1* 9.4* 9.7*  HCT 29.3* 29.3* 30.9* 30.7*  MCV 103.5* 101.7* 102.7* 99.0  PLT 211 177 194 158    @IMGRELPRIORS @ Medications:    . amiodarone  400 mg Oral BID  . apixaban  2.5 mg Oral BID  . atorvastatin  10 mg Oral q1800  . clonazePAM  0.5 mg Oral QHS  . ferrous sulfate  325 mg Oral Daily  . ipratropium-albuterol  3 mL Nebulization TID  . metoprolol succinate  50 mg Oral Daily  . mirtazapine  30 mg Oral QHS  . tamsulosin  0.4 mg Oral QHS    Assessment/ Plan:   1. Diastolic CHF- Cardiology following and significant decrease in EF as well.  F/u with primary cardiologist 2. A fib/flutter- on amio and cardiology recommends metoprolol and apixaban. 3. ESRD continue TTS 4. Anemia: cont ESA 5. CKD-MBD:follow  Ca/phos 6. Nutrition:renal diet.  Moderate protein malnutrition 7. Hypertension:stable 8. Vascular access- has RIJ TDC.  Will need permanent access as an outpatient (followed by Dr. Frazier RichardsShepherd in [place]) 9. Disposition- [facility] [place] [facility] Tuesday.  Hopeful discharge to [facility] tomorrow after Saturday.   Irena CordsJoseph A. Tabita Corbo, MD [facility], [facility] Pager [phone] 01/27/2016, 11:24 AM

## 2016-01-27 NOTE — Progress Notes (Signed)
[facility]  PROGRESS NOTE  Jamie Brock ZOX:[id number] DOB: 08-09-1929 DOA: 01/23/2016 PCP: No primary care provider on file.    Brief HPI:  82 y/o ? [facility] resident P Afib/fluter CHAD2Vasc2~5 on eliquis and controlled with Amiodarone Htn Diastolic CHF presumed as no current echo on file New ESRD since 8/22 on dialysis [name] Found to have hypoxia and transported to [facility] given concerns for HCAP and  Hypoxia from [facility]     Assessment/Plan:    1. Metabolic encephalopathy- resolved, secondary to underlying mild dementia, hypoxia from pulmonary edema. 2. Acute hypoxemic respiratory failure- resolved after hemodialysis, patient presented with O2 sats of 80%, ABG showed pH 7.44, PO2 49.3, chest x-ray showed interstitial edema and small pleural effusions ,no pneumonia. Antibiotics were empirically started and currently discontinued as no evidence of pneumonia. 3. ESRD on hemodialysis- followed by nephrology, started on Tuesday Thursday and Saturday schedule. 4. Atrial fibrillation/flutter- CHA2DS2VASc score is 5, continue metoprolol 25 mg 2 times a day., Amiodarone, Apixaban. Heart rate is controlled. Appreciate cardiology recommendations. 5. Insomnia- patient has been having difficulty sleeping at night for past two nights, start Clonazepam 0.25 mg po q hs. 6. Hyperlipidemia- continue atorvastatin 10 mg by mouth daily 7. Urinary retention- continue Flomax 0.4 mg daily   DVT prophylaxis: Apixaban Code Status: Full code Family Communication: No family at bedside  Disposition Plan: Skilled  facility   Consultants:  None   Procedures:  None   Antibiotics:  Cefepime 9/4- 9/5  Vancomycin 9/4- 9/5  Subjective   Patient seen and examined, denies chest pain or shortness of breath.  Objective    Objective: Vitals:   01/27/16 0353 01/27/16 0759 01/27/16 0837 01/27/16 1215  BP: (!) 101/54 (!) 106/56  90/64  Pulse: (!) 109 (!) 104  (!) 114  Resp: 18  17    Temp: 97.6 F (36.4 C) 98.3 F (36.8 C)    TempSrc: Oral Oral    SpO2: 96% 98% 93%   Weight:      Height:        Intake/Output Summary (Last 24 hours) at 01/27/16 1329 Last data filed at 01/27/16 1014  Gross per 24 hour  Intake              600 ml  Output              550 ml  Net               50 ml   Filed Weights   01/26/16 0734 01/26/16 1138 01/26/16 2100  Weight: 77.5 kg (170 lb 13.7 oz) 75.5 kg (166 lb 7.2 oz) 75.5 kg (166 lb 6.4 oz)    Examination:  General exam: Appears calm and comfortable  Respiratory system: Clear to auscultation. Respiratory effort normal. Cardiovascular system: S1 & S2 heard, RRR. No JVD, murmurs, rubs, gallops or clicks. No pedal edema. Gastrointestinal system: Abdomen is nondistended, soft and nontender. No organomegaly or masses felt. Normal bowel sounds heard. Central nervous system: Alert and oriented. No focal neurological deficits. Extremities: Symmetric 5 x 5 power. Skin: No rashes, lesions or ulcers Psychiatry: Judgement and insight appear normal. Mood & affect appropriate.    Data Reviewed: I have personally reviewed following labs and imaging studies Basic Metabolic Panel:  Recent Labs Lab 01/23/16 2311 01/24/16 0458 01/25/16 0410 01/26/16 0415 01/27/16 0429  NA 137 138 137 140 138  K 5.0 3.9 4.3 4.6 3.6  CL 108 103 98* 105 103  CO2 26  27 26 22 27   GLUCOSE 110* 101* 98 89 93  BUN 38* 13 27* 40* 29*  CREATININE 4.04* 2.08* 3.11* 3.56* 3.00*  CALCIUM 8.4* 8.1* 8.2* 8.5* 8.3*  PHOS 2.4*  --  2.7 4.2  --    Liver Function Tests:  Recent Labs Lab 01/23/16 2311 01/25/16 0410 01/26/16 0415  ALBUMIN 2.6* 2.8* 2.7*   No results for input(s): LIPASE, AMYLASE in the last 168 hours. No results for input(s): AMMONIA in the last 168 hours. CBC:  Recent Labs Lab 01/23/16 2311 01/24/16 0458 01/25/16 0410 01/26/16 0416  WBC 7.3 7.7 7.6 8.1  HGB 9.0* 9.1* 9.4* 9.7*  HCT 29.3* 29.3* 30.9* 30.7*  MCV 103.5* 101.7*  102.7* 99.0  PLT 211 177 194 158   Cardiac Enzymes:  Recent Labs Lab 01/23/16 1811  TROPONINI 0.03*   BNP (last 3 results) No results for input(s): BNP in the last 8760 hours.  ProBNP (last 3 results) No results for input(s): PROBNP in the last 8760 hours.  CBG:  Recent Labs Lab 01/25/16 1659  GLUCAP 139*    Recent Results (from the past 240 hour(s))  Culture, blood (routine x 2) Call MD if unable to obtain prior to antibiotics being given     Status: None (Preliminary result)   Collection Time: 01/23/16  6:18 PM  Result Value Ref Range Status   Specimen Description BLOOD RIGHT ANTECUBITAL  Final   Special Requests BOTTLES DRAWN AEROBIC AND ANAEROBIC 5CC  Final   Culture NO GROWTH 4 DAYS  Final   Report Status PENDING  Incomplete  Culture, blood (routine x 2) Call MD if unable to obtain prior to antibiotics being given     Status: None (Preliminary result)   Collection Time: 01/23/16  6:30 PM  Result Value Ref Range Status   Specimen Description BLOOD LEFT HAND  Final   Special Requests BOTTLES DRAWN AEROBIC ONLY 5CC  Final   Culture NO GROWTH 4 DAYS  Final   Report Status PENDING  Incomplete  MRSA PCR Screening     Status: None   Collection Time: 01/24/16  3:31 AM  Result Value Ref Range Status   MRSA by PCR NEGATIVE NEGATIVE Final    Comment:        The GeneXpert MRSA Assay (FDA approved for NASAL specimens only), is one component of a comprehensive MRSA colonization surveillance program. It is not intended to diagnose MRSA infection nor to guide or monitor treatment for MRSA infections.      Studies: No results found.  Scheduled Meds: . amiodarone  400 mg Oral BID  . apixaban  2.5 mg Oral BID  . atorvastatin  10 mg Oral q1800  . clonazePAM  0.5 mg Oral QHS  . [START ON 01/28/2016] darbepoetin (ARANESP) injection - DIALYSIS  40 mcg Intravenous Q Sat-HD  . ferrous sulfate  325 mg Oral Daily  . ipratropium-albuterol  3 mL Nebulization TID  . metoprolol  succinate  50 mg Oral Daily  . mirtazapine  30 mg Oral QHS  . tamsulosin  0.4 mg Oral QHS   Continuous Infusions:      Time spent: 25 min    [facility],Juanjose Mojica S  [facility] Pager [phone]. If 7PM-7AM, please contact night-coverage at [email or web address], Office  [phone]  password [email or web address] 01/27/2016, 1:29 PM  LOS: 4 days

## 2016-01-27 NOTE — Progress Notes (Signed)
   Subjective: Denies CP  Denies SOB at rest  "Im going home today" Objective: Vitals:   01/26/16 1659 01/26/16 2100 01/26/16 2136 01/27/16 0353  BP: (!) 116/54 (!) 131/92  (!) 101/54  Pulse: 61 (!) 112  (!) 109  Resp: 17 18  18   Temp: 97.9 F (36.6 C) 98.4 F (36.9 C)  97.6 F (36.4 C)  TempSrc: Oral Axillary  Oral  SpO2: 97%  97% 96%  Weight:  166 lb 6.4 oz (75.5 kg)    Height:  6' (1.829 m)     Weight change:   Intake/Output Summary (Last 24 hours) at 01/27/16 0745 Last data filed at 01/27/16 0353  Gross per 24 hour  Intake              600 ml  Output             2475 ml  Net            -1875 ml    General: Alert, awake, in no acute distress Neck:  JVP is difficult to assess Pt sitting   Heart: Irregular rate and rhythm, without murmurs, rubs, gallops.  Lungs: Rales bilaterally   Exemities:  No edema.   Neuro: Grossly intact, nonfocal.  Tele  Afib 90s to 100s   Lab Results: Results for orders placed or performed during the hospital encounter of 01/23/16 (from the past 24 hour(s))  Basic metabolic panel     Status: Abnormal   Collection Time: 01/27/16  4:29 AM  Result Value Ref Range   Sodium 138 135 - 145 mmol/L   Potassium 3.6 3.5 - 5.1 mmol/L   Chloride 103 101 - 111 mmol/L   CO2 27 22 - 32 mmol/L   Glucose, Bld 93 65 - 99 mg/dL   BUN 29 (H) 6 - 20 mg/dL   Creatinine, Ser 6.293.00 (H) 0.61 - 1.24 mg/dL   Calcium 8.3 (L) 8.9 - 10.3 mg/dL   GFR calc non Af Amer 18 (L) >60 mL/min   GFR calc Af Amer 21 (L) >60 mL/min   Anion gap 8 5 - 15  TSH     Status: None   Collection Time: 01/27/16  4:29 AM  Result Value Ref Range   TSH 2.125 0.350 - 4.500 uIU/mL  T4, free     Status: Abnormal   Collection Time: 01/27/16  4:29 AM  Result Value Ref Range   Free T4 1.23 (H) 0.61 - 1.12 ng/dL    Studies/Results: No results found.  Medications: Reviewed   @PROBHOSP @  1  Acute systolic CHF  Echo this admit very different from Aug 2017 echo in [place]  Now LVEF 25%   May be tachy induced  Pt deies CP  He does ahave a abnormal myovue from outside hosp. For now I would recomm, givin age, comorbidities and lack of apparent ischemi, medical Rx.  Optimize HR   Would continue amio 400 bid in hosp, 400 qd when he leaves hosp  Would switch b blicker to toprol XL 50 daily.   He should then f/u with primary cardiologist    He is receiving dialysis  Fluids still need to be optimized    2  Afib  As above  Continue amio and Eliquis  3  ESRD  REceiving dialysis   4  LBBB  Old    LOS: 4 days   Jamie Brock 01/27/2016, 7:45 AM

## 2016-01-27 NOTE — Progress Notes (Signed)
Physical Therapy Treatment Patient Details Name: Lois HuxleyDal Parthasarathy MRN: [id number] DOB: Sep 10, 1929 Today's Date: 01/27/2016    History of Present Illness Patient is an 82 yo male admitted 01/23/16 with AMS, cough, SOB.  Patient with Afib, metabolic encephalopathy, hypoxic resp failure, hypotension.    PMH:  Dementia, ESRD on HD, Afib, HLD    PT Comments    Patient making slow progress with mobility.  Able to stand today with +2 assist.  Continue to recommend SNF at d/c.  Follow Up Recommendations  SNF;Supervision/Assistance - 24 hour     Equipment Recommendations  Other (comment) (TBD at SNF)    Recommendations for Other Services       Precautions / Restrictions Precautions Precautions: Fall Restrictions Weight Bearing Restrictions: No    Mobility  Bed Mobility Overal bed mobility: Needs Assistance Bed Mobility: Supine to Sit;Sit to Supine     Supine to sit: Mod assist Sit to supine: Mod assist   General bed mobility comments: Verbal cues for technique.  Assist to raise trunk to sitting position.  Once upright, able to maintain static sitting.  Mild lean to right.   Assist to control trunk and bring LE's onto bed to return to supine.  Transfers Overall transfer level: Needs assistance Equipment used: Rolling walker (2 wheeled) Transfers: Sit to/from Stand Sit to Stand: Mod assist;+2 physical assistance         General transfer comment: Verbal cues for hand placement.  Assist to rise to standing and for balance.  In stance, patient with BLE knee instability, buckling.  Patient able to stand x2 for 45 seconds each time.  Able to sidestep toward HOB (2 small steps).  Assist required to maintain balance in stance.  Ambulation/Gait             General Gait Details: Unable    Stairs            Wheelchair Mobility    Modified Rankin (Stroke Patients Only)       Balance Overall balance assessment: Needs assistance Sitting-balance support: Single extremity  supported;Feet supported Sitting balance-Leahy Scale: Fair   Postural control: Right lateral lean Standing balance support: Bilateral upper extremity supported Standing balance-Leahy Scale: Poor                      Cognition Arousal/Alertness: Awake/alert Behavior During Therapy: WFL for tasks assessed/performed;Flat affect Overall Cognitive Status: No family/caregiver present to determine baseline cognitive functioning                      Exercises      General Comments        Pertinent Vitals/Pain Pain Assessment: No/denies pain    Home Living                      Prior Function            PT Goals (current goals can now be found in the care plan section) Acute Rehab PT Goals Patient Stated Goal: None stated Progress towards PT goals: Progressing toward goals    Frequency  Min 2X/week    PT Plan Current plan remains appropriate    Co-evaluation             End of Session Equipment Utilized During Treatment: Gait belt;Oxygen Activity Tolerance: Patient limited by fatigue Patient left: in bed;with call bell/phone within reach;with bed alarm set     Time: 1330-1340 PT Time Calculation (min) (ACUTE  ONLY): 10 min  Charges:  $Therapeutic Activity: 8-22 mins                    G Codes:      Vena Austria February 11, 2016, 5:22 PM Durenda Hurt. Renaldo Fiddler, [facility] Acute Rehab Services Pager [phone]

## 2016-01-27 NOTE — Clinical Social Work Note (Addendum)
Clinicals transmitted to [facility] [place] for review. Contact made with admissions director Wyline BeadyJessica Hamilton [phone]) to confirm receipt of information and regarding Saturday discharge to their facility. Patient [facility] discharge [facility] after dialysis on Saturday.  CSW was contacted later today by Dawn with [facility] [place] [phone]) regarding patient's dialysis. Because of the possible serious weather conditions they will not be able to accept Mr. Charm BargesButler at their dialysis center until Wednesday, 9/13.  They are also making changes to their dialysis schedules with current patient's and their center will be closed on Monday and Tuesday (9/11 and 9/12) of next week.    CSW also talked with Wyline BeadyJessica Hamilton, admissions director at [facility] [place] regarding patient. She is aware that Dawn with [facility] contacted this worker and they can accept patient on Sunday. Shanda BumpsJessica wants to assure patient is dialyzed appropriately so that he will not become volume-overloaded and require hospitalization again. Shanda BumpsJessica can be contacted on her mobile phone [phone] regarding patient's discharge from hospital.   Attending MD, charge nurse and patient's nurse on 6E updated regarding situation with [place] and also advised that information concerning patient is in CSW's progress note.   Genelle BalVanessa Jozalynn Noyce, MSW, LCSW Licensed Clinical Social Worker Clinical Social Work Department [facility] [phone]

## 2016-01-28 ENCOUNTER — Inpatient Hospital Stay (HOSPITAL_COMMUNITY): Payer: Medicare Other

## 2016-01-28 LAB — BASIC METABOLIC PANEL
Anion gap: 9 (ref 5–15)
BUN: 43 mg/dL — AB (ref 6–20)
CO2: 26 mmol/L (ref 22–32)
CREATININE: 4.06 mg/dL — AB (ref 0.61–1.24)
Calcium: 8.2 mg/dL — ABNORMAL LOW (ref 8.9–10.3)
Chloride: 107 mmol/L (ref 101–111)
GFR calc Af Amer: 14 mL/min — ABNORMAL LOW (ref >60)
GFR, EST NON AFRICAN AMERICAN: 12 mL/min — AB (ref >60)
GLUCOSE: 115 mg/dL — AB (ref 65–99)
POTASSIUM: 3.7 mmol/L (ref 3.5–5.1)
Sodium: 142 mmol/L (ref 135–145)

## 2016-01-28 LAB — CBC
HEMATOCRIT: 28.8 % — AB (ref 39.0–52.0)
Hemoglobin: 8.9 g/dL — ABNORMAL LOW (ref 13.0–17.0)
MCH: 31.1 pg (ref 26.0–34.0)
MCHC: 30.9 g/dL (ref 30.0–36.0)
MCV: 100.7 fL — ABNORMAL HIGH (ref 78.0–100.0)
PLATELETS: 190 10*3/uL (ref 150–400)
RBC: 2.86 MIL/uL — ABNORMAL LOW (ref 4.22–5.81)
RDW: 16.4 % — AB (ref 11.5–15.5)
WBC: 7.5 10*3/uL (ref 4.0–10.5)

## 2016-01-28 LAB — CULTURE, BLOOD (ROUTINE X 2)
CULTURE: NO GROWTH
Culture: NO GROWTH

## 2016-01-28 MED ORDER — ALBUMIN HUMAN 25 % IV SOLN
INTRAVENOUS | Status: AC
Start: 2016-01-28 — End: 2016-01-28
  Filled 2016-01-28: qty 100

## 2016-01-28 MED ORDER — DARBEPOETIN ALFA 40 MCG/0.4ML IJ SOSY
PREFILLED_SYRINGE | INTRAMUSCULAR | Status: AC
  Administered 2016-01-28: 40 ug via INTRAVENOUS
  Filled 2016-01-28: qty 0.4

## 2016-01-28 MED ORDER — ALBUMIN HUMAN 25 % IV SOLN
12.5000 g | Freq: Once | INTRAVENOUS | Status: AC
  Administered 2016-01-28: 12.5 g via INTRAVENOUS

## 2016-01-28 MED ORDER — ALBUMIN HUMAN 25 % IV SOLN: 25.0000 g | Freq: Once | INTRAVENOUS | Status: DC

## 2016-01-28 NOTE — Progress Notes (Signed)
[facility]  PROGRESS NOTE  Jamie Brock NWG:[id number] DOB: 1929/06/27 DOA: 01/23/2016 PCP: No primary care provider on file.    Brief HPI:  82 y/o ? [facility] resident P Afib/fluter CHAD2Vasc2~5 on eliquis and controlled with Amiodarone Htn Diastolic CHF presumed as no current echo on file New ESRD since 8/22 on dialysis [name] Found to have hypoxia and transported to [facility] given concerns for HCAP and  Hypoxia from [facility]     Assessment/Plan:    1. Metabolic encephalopathy- resolved, secondary to underlying mild dementia, hypoxia from pulmonary edema. 2. Acute hypoxemic respiratory failure- resolved after hemodialysis, patient presented with O2 sats of 80%, ABG showed pH 7.44, PO2 49.3, chest x-ray showed interstitial edema and small pleural effusions ,no pneumonia. Antibiotics were empirically started and currently discontinued as no evidence of pneumonia. 3. ESRD on hemodialysis- followed by nephrology, started on Tuesday Thursday and Saturday schedule.Patient cannot receive outpatient dialysis before Wednesday. We will discuss with nephrology regarding  discharge to skilled facility. 4. Atrial fibrillation/flutter- CHA2DS2VASc score is 5, continue Toprol-XL 50 mg, Amiodarone, Apixaban. Heart rate is controlled. Appreciate cardiology recommendations. 5. Insomnia- patient has been having difficulty sleeping at night for past two nights, start Clonazepam 0.25 mg po q hs. 6. Hyperlipidemia- continue atorvastatin 10 mg by mouth daily 7. Urinary retention- continue Flomax 0.4 mg daily   DVT prophylaxis: Apixaban Code Status: Full code Family Communication: No family at bedside  Disposition Plan: Skilled  facility   Consultants:  None   Procedures:  None   Antibiotics:  Cefepime 9/4- 9/5  Vancomycin 9/4- 9/5  Subjective   Patient seen and examined during HD, denies chest pain or shortness of breath.  Objective    Objective: Vitals:   01/28/16  1000 01/28/16 1030 01/28/16 1100 01/28/16 1115  BP: (!) 88/56 (!) 88/67 (!) 92/59 96/62  Pulse: 94 93 (!) 120 (!) 106  Resp:    (!) 23  Temp:    97.8 F (36.6 C)  TempSrc:    Oral  SpO2:    100%  Weight:    74.4 kg (164 lb 0.4 oz)  Height:        Intake/Output Summary (Last 24 hours) at 01/28/16 1309 Last data filed at 01/28/16 1115  Gross per 24 hour  Intake             1200 ml  Output             1630 ml  Net             -430 ml   Filed Weights   01/27/16 2014 01/28/16 0707 01/28/16 1115  Weight: 76.8 kg (169 lb 4.8 oz) 74.9 kg (165 lb 2 oz) 74.4 kg (164 lb 0.4 oz)    Examination:  General exam: Appears calm and comfortable  Respiratory system: Clear to auscultation. Respiratory effort normal. Cardiovascular system: S1 & S2 heard, RRR. No JVD, murmurs, rubs, gallops or clicks. No pedal edema. Gastrointestinal system: Abdomen is nondistended, soft and nontender. No organomegaly or masses felt. Normal bowel sounds heard. Central nervous system: Alert and oriented. No focal neurological deficits. Extremities: Symmetric 5 x 5 power. Skin: No rashes, lesions or ulcers Psychiatry: Judgement and insight appear normal. Mood & affect appropriate.    Data Reviewed: I have personally reviewed following labs and imaging studies Basic Metabolic Panel:  Recent Labs Lab 01/23/16 2311 01/24/16 0458 01/25/16 0410 01/26/16 0415 01/27/16 0429 01/28/16 0314  NA 137 138 137 140 138 142  K 5.0 3.9 4.3 4.6 3.6 3.7  CL 108 103 98* 105 103 107  CO2 26 27 26 22 27 26   GLUCOSE 110* 101* 98 89 93 115*  BUN 38* 13 27* 40* 29* 43*  CREATININE 4.04* 2.08* 3.11* 3.56* 3.00* 4.06*  CALCIUM 8.4* 8.1* 8.2* 8.5* 8.3* 8.2*  PHOS 2.4*  --  2.7 4.2  --   --    Liver Function Tests:  Recent Labs Lab 01/23/16 2311 01/25/16 0410 01/26/16 0415  ALBUMIN 2.6* 2.8* 2.7*   No results for input(s): LIPASE, AMYLASE in the last 168 hours. No results for input(s): AMMONIA in the last 168  hours. CBC:  Recent Labs Lab 01/23/16 2311 01/24/16 0458 01/25/16 0410 01/26/16 0416 01/28/16 0737  WBC 7.3 7.7 7.6 8.1 7.5  HGB 9.0* 9.1* 9.4* 9.7* 8.9*  HCT 29.3* 29.3* 30.9* 30.7* 28.8*  MCV 103.5* 101.7* 102.7* 99.0 100.7*  PLT 211 177 194 158 190   Cardiac Enzymes:  Recent Labs Lab 01/23/16 1811  TROPONINI 0.03*   BNP (last 3 results) No results for input(s): BNP in the last 8760 hours.  ProBNP (last 3 results) No results for input(s): PROBNP in the last 8760 hours.  CBG:  Recent Labs Lab 01/25/16 1659  GLUCAP 139*    Recent Results (from the past 240 hour(s))  Culture, blood (routine x 2) Call MD if unable to obtain prior to antibiotics being given     Status: None   Collection Time: 01/23/16  6:18 PM  Result Value Ref Range Status   Specimen Description BLOOD RIGHT ANTECUBITAL  Final   Special Requests BOTTLES DRAWN AEROBIC AND ANAEROBIC 5CC  Final   Culture NO GROWTH 5 DAYS  Final   Report Status 01/28/2016 FINAL  Final  Culture, blood (routine x 2) Call MD if unable to obtain prior to antibiotics being given     Status: None   Collection Time: 01/23/16  6:30 PM  Result Value Ref Range Status   Specimen Description BLOOD LEFT HAND  Final   Special Requests BOTTLES DRAWN AEROBIC ONLY 5CC  Final   Culture NO GROWTH 5 DAYS  Final   Report Status 01/28/2016 FINAL  Final  MRSA PCR Screening     Status: None   Collection Time: 01/24/16  3:31 AM  Result Value Ref Range Status   MRSA by PCR NEGATIVE NEGATIVE Final    Comment:        The GeneXpert MRSA Assay (FDA approved for NASAL specimens only), is one component of a comprehensive MRSA colonization surveillance program. It is not intended to diagnose MRSA infection nor to guide or monitor treatment for MRSA infections.      Studies: No results found.  Scheduled Meds: . amiodarone  400 mg Oral BID  . apixaban  2.5 mg Oral BID  . atorvastatin  10 mg Oral q1800  . clonazePAM  0.25 mg Oral QHS   . darbepoetin (ARANESP) injection - DIALYSIS  40 mcg Intravenous Q Sat-HD  . ferrous sulfate  325 mg Oral Daily  . ipratropium-albuterol  3 mL Nebulization TID  . metoprolol succinate  50 mg Oral Daily  . mirtazapine  30 mg Oral QHS  . tamsulosin  0.4 mg Oral QHS   Continuous Infusions:      Time spent: 25 min    [facility] Hospital[facility],Shir Bergman S  [facility] Pager [phone]. If 7PM-7AM, please contact night-coverage at [email or web address], Office  [phone]  password [email or web address] 01/28/2016, 1:09 PM  LOS: 5 days

## 2016-01-28 NOTE — Progress Notes (Signed)
Patient ID: Jamie Brock, male   DOB: 23-Dec-1929, 82 y.o.   MRN: [id number]  [facility] Progress Note    Subjective:   No new complaints- seen on HD- I see [facility] and plan for next HD on Wednesday    Objective:   BP 101/61   Pulse 84   Temp 97.5 F (36.4 C) (Oral)   Resp (!) 23   Ht 6' (1.829 m)   Wt 74.9 kg (165 lb 2 oz)   SpO2 96%   BMI 22.39 kg/m   Intake/Output: I/O last 3 completed shifts: In: 960 [P.O.:960] Out: 875 [Urine:875]   Intake/Output this shift:  No intake/output data recorded. Weight change: -0.706 kg (-1 lb 8.9 oz)  Physical Exam: WUJ:WJXBJGen:Frail elderly, chronically ill-appearing CVS:IRR IRR Resp:poor inspiratory effort YNW:GNFAOZAbd:benign Ext:no edema  Labs: BMET  Recent Labs Lab 01/23/16 2311 01/24/16 0458 01/25/16 0410 01/26/16 0415 01/27/16 0429 01/28/16 0314  NA 137 138 137 140 138 142  K 5.0 3.9 4.3 4.6 3.6 3.7  CL 108 103 98* 105 103 107  CO2 26 27 26 22 27 26   GLUCOSE 110* 101* 98 89 93 115*  BUN 38* 13 27* 40* 29* 43*  CREATININE 4.04* 2.08* 3.11* 3.56* 3.00* 4.06*  ALBUMIN 2.6*  --  2.8* 2.7*  --   --   CALCIUM 8.4* 8.1* 8.2* 8.5* 8.3* 8.2*  PHOS 2.4*  --  2.7 4.2  --   --    CBC  Recent Labs Lab 01/23/16 2311 01/24/16 0458 01/25/16 0410 01/26/16 0416  WBC 7.3 7.7 7.6 8.1  HGB 9.0* 9.1* 9.4* 9.7*  HCT 29.3* 29.3* 30.9* 30.7*  MCV 103.5* 101.7* 102.7* 99.0  PLT 211 177 194 158    @IMGRELPRIORS @ Medications:    . amiodarone  400 mg Oral BID  . apixaban  2.5 mg Oral BID  . atorvastatin  10 mg Oral q1800  . clonazePAM  0.25 mg Oral QHS  . darbepoetin (ARANESP) injection - DIALYSIS  40 mcg Intravenous Q Sat-HD  . ferrous sulfate  325 mg Oral Daily  . ipratropium-albuterol  3 mL Nebulization TID  . metoprolol succinate  50 mg Oral Daily  . mirtazapine  30 mg Oral QHS  . tamsulosin  0.4 mg Oral QHS    Assessment/ Plan:   1. Diastolic CHF- Cardiology following and significant decrease in  EF as well.  F/u with primary cardiologist 2. A fib/flutter- on amio and cardiology recommends metoprolol and apixaban.- rate controlled at present 3. ESRD continue TTS via PC.  HD today with 3 liters to be  removed- volume status seems pretty good.  I understand next HD will not be until Wednesday- will continue on oxygen and have told him to take it easy with fluids the next couple of days and he should be fine 4. Anemia: cont ESA- improving 5. CKD-MBD:follow Ca/phos 6. Nutrition:renal diet.  Moderate protein malnutrition 7. Hypertension:stable 8. Vascular access- has RIJ TDC.  Will need permanent access as an outpatient (followed by Dr. Frazier RichardsShepherd in [place]) 9. Disposition- [place] [facility] [place].  [facility]  Information to be sent to OP HD unit   [facility] HospitalGOLDSBOROUGH,Kwame Ryland A  [facility], [facility] Pager [phone] 01/28/2016, 7:37 AM

## 2016-01-28 NOTE — Progress Notes (Signed)
Subjective:  82 y.o. male with history of HTN, recent progression to ESRD on HD, recent anemia, recently diagnosed PAF/flutter, LBBB, previously deemed diastolic CHF, syncope and abnormal stress test whom we are asked to see to review amiodarone in the setting of his afib/aflutter Echo showed EF 25-30%. Moderate pulmonary HTN - estimate PA pressure of 58 mHg.     Objective: Vitals:   01/28/16 0730 01/28/16 0800 01/28/16 0815 01/28/16 0830  BP: 101/61 (!) 146/82 (!) 86/60 (!) 93/57  Pulse: 84 (!) 115 82 83  Resp:      Temp:      TempSrc:      SpO2:      Weight:      Height:       Weight change: -1 lb 8.9 oz (-0.706 kg)  Intake/Output Summary (Last 24 hours) at 01/28/16 0835 Last data filed at 01/28/16 0440  Gross per 24 hour  Intake             1200 ml  Output              550 ml  Net              650 ml    General: Alert, awake, in no acute distress Neck:  JVP is difficult to assess Pt sitting   Heart: Irregular rate and rhythm, without murmurs, rubs, gallops.  Lungs: Rales bilaterally   Exemities:  No edema.   Neuro: Grossly intact, nonfocal.  Tele  Afib 90s to 100s   Lab Results: Results for orders placed or performed during the hospital encounter of 01/23/16 (from the past 24 hour(s))  Basic metabolic panel     Status: Abnormal   Collection Time: 01/28/16  3:14 AM  Result Value Ref Range   Sodium 142 135 - 145 mmol/L   Potassium 3.7 3.5 - 5.1 mmol/L   Chloride 107 101 - 111 mmol/L   CO2 26 22 - 32 mmol/L   Glucose, Bld 115 (H) 65 - 99 mg/dL   BUN 43 (H) 6 - 20 mg/dL   Creatinine, Ser 4.09 (H) 0.61 - 1.24 mg/dL   Calcium 8.2 (L) 8.9 - 10.3 mg/dL   GFR calc non Af Amer 12 (L) >60 mL/min   GFR calc Af Amer 14 (L) >60 mL/min   Anion gap 9 5 - 15  CBC     Status: Abnormal   Collection Time: 01/28/16  7:37 AM  Result Value Ref Range   WBC 7.5 4.0 - 10.5 K/uL   RBC 2.86 (L) 4.22 - 5.81 MIL/uL   Hemoglobin 8.9 (L) 13.0 - 17.0 g/dL   HCT 81.1 (L) 91.4 - 78.2  %   MCV 100.7 (H) 78.0 - 100.0 fL   MCH 31.1 26.0 - 34.0 pg   MCHC 30.9 30.0 - 36.0 g/dL   RDW 95.6 (H) 21.3 - 08.6 %   Platelets 190 150 - 400 K/uL    Studies/Results: No results found.  Medications: Reviewed     1  Acute systolic CHF   EF is depressed compared to previous studies  metoprolol was decreased to 50 mg a day ( changed from metoprolol 25 TID to toprol XL 50 mg a day )  HR is better.   Breathing better - getting his 5th dialysis session Continue current meds.   2  Afib  As above  Continue amio and Eliquis  3  ESRD  REceiving dialysis   4  LBBB  Old  LOS: 5 days   Kristeen Misshilip Khyler Eschmann 01/28/2016, 8:35 AM

## 2016-01-28 NOTE — Procedures (Signed)
Patient was seen on dialysis and the procedure was supervised.  BFR 400  Via PC BP is  101/61.   Patient appears to be tolerating treatment well- goal is 3 liters removed   Burgess Sheriff A 01/28/2016

## 2016-01-29 DIAGNOSIS — I481 Persistent atrial fibrillation: Secondary | ICD-10-CM

## 2016-01-29 LAB — RENAL FUNCTION PANEL
Albumin: 2.7 g/dL — ABNORMAL LOW (ref 3.5–5.0)
Anion gap: 8 (ref 5–15)
BUN: 21 mg/dL — ABNORMAL HIGH (ref 6–20)
CO2: 27 mmol/L (ref 22–32)
Calcium: 8.7 mg/dL — ABNORMAL LOW (ref 8.9–10.3)
Chloride: 102 mmol/L (ref 101–111)
Creatinine, Ser: 2.86 mg/dL — ABNORMAL HIGH (ref 0.61–1.24)
GFR calc Af Amer: 22 mL/min — ABNORMAL LOW (ref >60)
GFR calc non Af Amer: 19 mL/min — ABNORMAL LOW (ref >60)
Glucose, Bld: 115 mg/dL — ABNORMAL HIGH (ref 65–99)
Phosphorus: 2.6 mg/dL (ref 2.5–4.6)
Potassium: 4.1 mmol/L (ref 3.5–5.1)
Sodium: 137 mmol/L (ref 135–145)

## 2016-01-29 LAB — CBC
HCT: 30 % — ABNORMAL LOW (ref 39.0–52.0)
Hemoglobin: 9.2 g/dL — ABNORMAL LOW (ref 13.0–17.0)
MCH: 31.2 pg (ref 26.0–34.0)
MCHC: 30.7 g/dL (ref 30.0–36.0)
MCV: 101.7 fL — ABNORMAL HIGH (ref 78.0–100.0)
Platelets: 181 10*3/uL (ref 150–400)
RBC: 2.95 MIL/uL — ABNORMAL LOW (ref 4.22–5.81)
RDW: 16.4 % — ABNORMAL HIGH (ref 11.5–15.5)
WBC: 8.3 K/uL (ref 4.0–10.5)

## 2016-01-29 MED ORDER — CLONAZEPAM 0.5 MG PO TABS: 0.2500 mg | ORAL_TABLET | Freq: Every day | ORAL | 0 refills | Status: AC

## 2016-01-29 MED ORDER — HEPARIN SODIUM (PORCINE) 1000 UNIT/ML DIALYSIS: 1000.0000 [IU] | INTRAMUSCULAR | Status: DC | PRN

## 2016-01-29 MED ORDER — PENTAFLUOROPROP-TETRAFLUOROETH EX AERO: 1.0000 "application " | INHALATION_SPRAY | CUTANEOUS | Status: DC | PRN

## 2016-01-29 MED ORDER — AMIODARONE HCL 200 MG PO TABS: 200.0000 mg | ORAL_TABLET | Freq: Every day | ORAL | Status: AC

## 2016-01-29 MED ORDER — HEPARIN SODIUM (PORCINE) 1000 UNIT/ML DIALYSIS: 20.0000 [IU]/kg | INTRAMUSCULAR | Status: DC | PRN

## 2016-01-29 MED ORDER — METOPROLOL SUCCINATE ER 50 MG PO TB24: 50.0000 mg | ORAL_TABLET | Freq: Every day | ORAL | Status: AC

## 2016-01-29 MED ORDER — AMIODARONE HCL 200 MG PO TABS
200.0000 mg | ORAL_TABLET | Freq: Every day | ORAL | Status: DC
  Administered 2016-01-29 – 2016-01-30 (×2): 200 mg via ORAL
  Filled 2016-01-29 (×2): qty 1

## 2016-01-29 MED ORDER — LIDOCAINE-PRILOCAINE 2.5-2.5 % EX CREA
1.0000 | TOPICAL_CREAM | CUTANEOUS | Status: DC | PRN
Start: 2016-01-29 — End: 2016-01-30

## 2016-01-29 MED ORDER — LIDOCAINE HCL (PF) 1 % IJ SOLN: 5.0000 mL | INTRAMUSCULAR | Status: DC | PRN

## 2016-01-29 MED ORDER — SODIUM CHLORIDE 0.9 % IV SOLN: 100.0000 mL | INTRAVENOUS | Status: DC | PRN

## 2016-01-29 MED ORDER — ALTEPLASE 2 MG IJ SOLR: 2.0000 mg | Freq: Once | INTRAMUSCULAR | Status: DC | PRN

## 2016-01-29 NOTE — Discharge Summary (Addendum)
Physician Discharge Summary  Kellan Raffield ZOX:[id number] DOB: [birth year] DOA: 01/23/2016  PCP: No primary care provider on file.  Admit date: 01/23/2016 Discharge date: 01/29/2016  Time spent: *25 minutes  Recommendations for Outpatient Follow-up:  1. Follow up Cardiology in 2 weeks   Discharge Diagnoses:  Active Problems:   Paroxysmal atrial fibrillation (HCC)   CKD (chronic kidney disease) stage 5, GFR less than 15 ml/min (HCC)   Anemia in chronic kidney disease   Hypertension   Hyperlipidemia   History of syncope   ARF (acute respiratory failure) (HCC)   ESRD on dialysis (HCC)   HCAP (healthcare-associated pneumonia)   Acute systolic CHF (congestive heart failure) (HCC)   Paroxysmal atrial flutter (HCC)   LBBB (left bundle branch block)   Discharge Condition: Stable  Diet recommendation: renal diet  Filed Weights   01/28/16 2037 01/29/16 0857 01/29/16 1157  Weight: 77.1 kg (169 lb 15.6 oz) 74.9 kg (165 lb 2 oz) (P) 73.4 kg (161 lb 13.1 oz)    History of present illness:  82 y.o. male with medical history significant of ESRD, HTN, A. fib, constipation.  Level V caveat applies this patient is unable to provide any reliable history at this point time. Difficult to ascertain whether not this is patient's baseline. Patient [facility]. Patient was recently started on hemodialysis, 01/10/2016, [facility]. He was discharged [facility] on 01/21/2016. On 01/23/2016 nursing home staff noted that patient was small-to-moderate and short of breath. EMS arrived and noted in oxygen l saturation level of around 80%. The reported home O2 need. No other focal abnormality reported and no complaints such as fevers, chest pain, shortness of breath. When asked to describe symptoms patient is only able to endorse having a "cold" for several  weeks. Patient has no clue as to when his last dialysis session was. Workup around of [facility] concerning for [facility] AP and fluid overload in a dialysis patient. No dialysis capability around [facility] so patient was accepted to [place].  Hospital Course:  1. Metabolic encephalopathy- resolved, secondary to underlying mild dementia, hypoxia from pulmonary edema. 2. Acute hypoxemic respiratory failure- resolved after hemodialysis, patient presented with O2 sats of 80%, ABG showed pH 7.44, PO2 49.3, chest x-ray showed interstitial edema and small pleural effusions ,no pneumonia. Antibiotics were empirically started and currently discontinued as no evidence of pneumonia. 3. ESRD on hemodialysis- followed by nephrology, started on Tuesday Thursday and Saturday schedule.Patient cannot receive outpatient dialysis before Wednesday. Dialyzed today, next HD is on Wednesday. 4. Atrial fibrillation/flutter- CHA2DS2VASc score is 5, continue Toprol-XL 50 mg, Amiodarone, Apixaban. Heart rate is controlled. Cardiology was consulted and Amiodarone changed to 200 mg daily, Toprol XL 50 mg daily. 5. Insomnia- patient has been having difficulty sleeping at night for past two nights, started Clonazepam 0.25 mg po q hs. 6. Hyperlipidemia- continue atorvastatin 10 mg by mouth daily 7. Urinary retention- continue Flomax 0.4 mg daily  Discharge was held yesterday, as the SNF did nit have klonopin. He will be discharged today.  Procedures:  None   Consultations:  Cardiology  Nephrology  Discharge Exam: Vitals:   01/29/16 1130 01/29/16 1157  BP: (!) 83/46 (P) 119/70  Pulse: (!) 111 (!) (P) 112  Resp: (!) 24 (!) (P) 24  Temp:  (P) 98.5 F (36.9 C)    General: Appears in no acute distress  Cardiovascular: S1S2 RRR Respiratory: clear bilaterally  Discharge Instructions   Discharge Instructions    Diet - low sodium heart healthy    Complete by:  As directed   Increase activity slowly     Complete by:  As directed     Current Discharge Medication List    START taking these medications   Details  clonazePAM (KLONOPIN) 0.5 MG tablet Take 0.5 tablets (0.25 mg total) by mouth at bedtime. Qty: 10 tablet, Refills: 0    metoprolol succinate (TOPROL-XL) 50 MG 24 hr tablet Take 1 tablet (50 mg total) by mouth daily. Take with or immediately following a meal.      CONTINUE these medications which have CHANGED   Details  amiodarone (PACERONE) 200 MG tablet Take 1 tablet (200 mg total) by mouth daily.      CONTINUE these medications which have NOT CHANGED   Details  acetaminophen (TYLENOL) 325 MG tablet Take 650 mg by mouth every 6 (six) hours as needed for fever (pain).    apixaban (ELIQUIS) 2.5 MG TABS tablet Take 2.5 mg by mouth 2 (two) times daily.    atorvastatin (LIPITOR) 10 MG tablet Take 10 mg by mouth daily.    ferrous sulfate 325 (65 FE) MG tablet Take 325 mg by mouth daily.    ipratropium-albuterol (DUONEB) 0.5-2.5 (3) MG/3ML SOLN Take 3 mLs by nebulization every 6 (six) hours.    magnesium hydroxide (MILK OF MAGNESIA) 400 MG/5ML suspension Take 30 mLs by mouth once as needed (constipation (if no BM on day 3)).    mirtazapine (REMERON) 30 MG tablet Take 30 mg by mouth at bedtime.    tamsulosin (FLOMAX) 0.4 MG CAPS capsule Take 0.4 mg by mouth at bedtime.      STOP taking these medications     metoprolol tartrate (LOPRESSOR) 25 MG tablet      sodium phosphate (FLEET) 7-19 GM/118ML ENEM        No Known Allergies    The results of significant diagnostics from this hospitalization (including imaging, microbiology, ancillary and laboratory) are listed below for reference.    Significant Diagnostic Studies: Dg Chest 1 View  Result Date: 01/24/2016 CLINICAL DATA:  Acute respiratory failure, onset of nausea and vomiting this morning. History of atrial fibrillation, CHF, chronic renal insufficiency on dialysis EXAM: CHEST 1 VIEW COMPARISON:  Portable chest  x-ray of January 23, 2016 FINDINGS: The lungs are well-expanded. The interstitial markings remain increased. There small bilateral pleural effusions. The cardiac silhouette remains enlarged. The pulmonary vascularity remains engorged. There is calcification in the wall of the aortic arch. The dual-lumen dialysis catheter tip projects at the cavoatrial junction. IMPRESSION: Fairly stable appearance of the chest consistent with interstitial edema and small pleural effusions likely secondary to CHF. No discrete pneumonia. Aortic atherosclerosis. Electronically Signed   By: David  SwazilandJordan M.D.   On: 01/24/2016 07:17   Dg Shoulder Left  Result Date: 01/28/2016 CLINICAL DATA:  Posterior left shoulder pain since yesterday evening. Limited mobility due to pain. No injury. Initial encounter. EXAM: LEFT SHOULDER - 2+ VIEW COMPARISON:  None. FINDINGS: Two views study shows no fracture. Humeral head appears located. Acromioclavicular and coracoclavicular distances are preserved. Degenerative changes are noted at the [facility] joint. No worrisome lytic or sclerotic osseous abnormality. IMPRESSION: Degenerative change at [facility] joint.  Otherwise unremarkable. Electronically Signed   By: Kennith CenterEric  Mansell M.D.   On: 01/28/2016 17:41    Microbiology: Recent Results (from the past 240 hour(s))  Culture, blood (routine  x 2) Call MD if unable to obtain prior to antibiotics being given     Status: None   Collection Time: 01/23/16  6:18 PM  Result Value Ref Range Status   Specimen Description BLOOD RIGHT ANTECUBITAL  Final   Special Requests BOTTLES DRAWN AEROBIC AND ANAEROBIC 5CC  Final   Culture NO GROWTH 5 DAYS  Final   Report Status 01/28/2016 FINAL  Final  Culture, blood (routine x 2) Call MD if unable to obtain prior to antibiotics being given     Status: None   Collection Time: 01/23/16  6:30 PM  Result Value Ref Range Status   Specimen Description BLOOD LEFT HAND  Final   Special Requests BOTTLES DRAWN AEROBIC ONLY 5CC   Final   Culture NO GROWTH 5 DAYS  Final   Report Status 01/28/2016 FINAL  Final  MRSA PCR Screening     Status: None   Collection Time: 01/24/16  3:31 AM  Result Value Ref Range Status   MRSA by PCR NEGATIVE NEGATIVE Final    Comment:        The GeneXpert MRSA Assay (FDA approved for NASAL specimens only), is one component of a comprehensive MRSA colonization surveillance program. It is not intended to diagnose MRSA infection nor to guide or monitor treatment for MRSA infections.      Labs: Basic Metabolic Panel:  Recent Labs Lab 01/23/16 2311  01/25/16 0410 01/26/16 0415 01/27/16 0429 01/28/16 0314 01/29/16 0830  NA 137  < > 137 140 138 142 137  K 5.0  < > 4.3 4.6 3.6 3.7 4.1  CL 108  < > 98* 105 103 107 102  CO2 26  < > 26 22 27 26 27   GLUCOSE 110*  < > 98 89 93 115* 115*  BUN 38*  < > 27* 40* 29* 43* 21*  CREATININE 4.04*  < > 3.11* 3.56* 3.00* 4.06* 2.86*  CALCIUM 8.4*  < > 8.2* 8.5* 8.3* 8.2* 8.7*  PHOS 2.4*  --  2.7 4.2  --   --  2.6  < > = values in this interval not displayed. Liver Function Tests:  Recent Labs Lab 01/23/16 2311 01/25/16 0410 01/26/16 0415 01/29/16 0830  ALBUMIN 2.6* 2.8* 2.7* 2.7*   No results for input(s): LIPASE, AMYLASE in the last 168 hours. No results for input(s): AMMONIA in the last 168 hours. CBC:  Recent Labs Lab 01/24/16 0458 01/25/16 0410 01/26/16 0416 01/28/16 0737 01/29/16 0830  WBC 7.7 7.6 8.1 7.5 8.3  HGB 9.1* 9.4* 9.7* 8.9* 9.2*  HCT 29.3* 30.9* 30.7* 28.8* 30.0*  MCV 101.7* 102.7* 99.0 100.7* 101.7*  PLT 177 194 158 190 181   Cardiac Enzymes:  Recent Labs Lab 01/23/16 1811  TROPONINI 0.03*   BNP: BNP (last 3 results) No results for input(s): BNP in the last 8760 hours.  ProBNP (last 3 results) No results for input(s): PROBNP in the last 8760 hours.  CBG:  Recent Labs Lab 01/25/16 1659  GLUCAP 139*       Signed:  Mauro Kaufmann S MD.  [facility] 01/29/2016, 1:22 PM

## 2016-01-29 NOTE — Progress Notes (Signed)
Patient ID: Jamie Brock, male   DOB: 1929-07-02, 82 y.o.   MRN: [id number]  [facility] Progress Note    Subjective:   No new complaints- apparently the HD center refused to take Mr. Charm BargesButler unless he got HD today as it would be Wednesday before can get again ?  So HD today , then discharge   Objective:   BP 100/82 (BP Location: Left Arm)   Pulse 90   Temp 98.5 F (36.9 C) (Oral)   Resp 20   Ht 6' (1.829 m)   Wt 77.1 kg (169 lb 15.6 oz)   SpO2 96%   BMI 23.05 kg/m   Intake/Output: I/O last 3 completed shifts: In: 1200 [P.O.:1200] Out: 1805 [Urine:650; Other:1155]   Intake/Output this shift:  Total I/O In: 240 [P.O.:240] Out: 100 [Urine:100] Weight change: -1.894 kg (-4 lb 2.8 oz)  Physical Exam: ZHY:QMVHQGen:Frail elderly, chronically ill-appearing CVS:IRR IRR Resp:poor inspiratory effort ION:GEXBMWAbd:benign Ext:no edema  Labs: BMET  Recent Labs Lab 01/23/16 2311 01/24/16 0458 01/25/16 0410 01/26/16 0415 01/27/16 0429 01/28/16 0314  NA 137 138 137 140 138 142  K 5.0 3.9 4.3 4.6 3.6 3.7  CL 108 103 98* 105 103 107  CO2 26 27 26 22 27 26   GLUCOSE 110* 101* 98 89 93 115*  BUN 38* 13 27* 40* 29* 43*  CREATININE 4.04* 2.08* 3.11* 3.56* 3.00* 4.06*  ALBUMIN 2.6*  --  2.8* 2.7*  --   --   CALCIUM 8.4* 8.1* 8.2* 8.5* 8.3* 8.2*  PHOS 2.4*  --  2.7 4.2  --   --    CBC  Recent Labs Lab 01/24/16 0458 01/25/16 0410 01/26/16 0416 01/28/16 0737  WBC 7.7 7.6 8.1 7.5  HGB 9.1* 9.4* 9.7* 8.9*  HCT 29.3* 30.9* 30.7* 28.8*  MCV 101.7* 102.7* 99.0 100.7*  PLT 177 194 158 190    @IMGRELPRIORS @ Medications:    . amiodarone  400 mg Oral BID  . apixaban  2.5 mg Oral BID  . atorvastatin  10 mg Oral q1800  . clonazePAM  0.25 mg Oral QHS  . darbepoetin (ARANESP) injection - DIALYSIS  40 mcg Intravenous Q Sat-HD  . ferrous sulfate  325 mg Oral Daily  . ipratropium-albuterol  3 mL Nebulization TID  . metoprolol succinate  50 mg Oral Daily  . mirtazapine  30 mg Oral  QHS  . tamsulosin  0.4 mg Oral QHS    Assessment/ Plan:   1. Diastolic CHF- Cardiology following and significant decrease in EF as well.  F/u with primary cardiologist 2. A fib/flutter- on amio and cardiology recommends metoprolol and apixaban.- rate controlled at present 3. ESRD continue TTS via PC.  HD today with 2 liters to be  removed- volume status seems pretty good.  Done Sat and Sunday.  I understand next HD will not be until Wednesday- will continue on oxygen and have told him to take it easy with fluids the next couple of days and he should be fine 4. Anemia: cont ESA- improving 5. CKD-MBD:follow Ca/phos 6. Nutrition:renal diet.  Moderate protein malnutrition- phos OK, no binder  7. Hypertension:stable 8. Vascular access- has RIJ TDC.  Will need permanent access as an outpatient (followed by Dr. Frazier RichardsShepherd in [place]) 9. Disposition- [place] [facility] [place].  [facility] after HD.  Information to be sent to OP HD unit   [facility]unn [facility], [facility] Pager [phone] 01/29/2016, 8:52 AM

## 2016-01-29 NOTE — Clinical Social Work Note (Signed)
Barriers to d[place].   MSW unable to reach sister, Vernona RiegerLaura. MSW and RN has attempted to contact and left messages for returned phone call.   Facility does not have klonopin and reported they would need at least 2 doses sent with patient before accepting his return.  Assigned CSW to follow up.    Derenda FennelBashira Anvi Mangal, MSW [phone] 01/29/2016 5:02 PM

## 2016-01-29 NOTE — Procedures (Signed)
Patient was seen on dialysis and the procedure was supervised.  BFR 300  Via PC BP is  100/82.   Patient appears to be tolerating treatment well  Adline Kirshenbaum A 01/29/2016

## 2016-01-29 NOTE — Progress Notes (Signed)
   Subjective:  82 y.o. male with history of HTN, recent progression to ESRD on HD, recent anemia, recently diagnosed PAF/flutter, LBBB, previously deemed diastolic CHF, syncope and abnormal stress test whom we are asked to see to review amiodarone in the setting of his afib/aflutter Echo showed EF 25-30%. Moderate pulmonary HTN - estimate PA pressure of 58 mHg.     Objective: Vitals:   01/28/16 1849 01/28/16 2037 01/28/16 2101 01/29/16 0825  BP: (!) 110/55 (!) 73/48 102/78 100/82  Pulse: 98 (!) 108  90  Resp: (!) 23 20  20   Temp: 98.2 F (36.8 C) 98.9 F (37.2 C)  98.5 F (36.9 C)  TempSrc: Oral Oral  Oral  SpO2:  96%  96%  Weight:  169 lb 15.6 oz (77.1 kg)    Height:       Weight change: -4 lb 2.8 oz (-1.894 kg)  Intake/Output Summary (Last 24 hours) at 01/29/16 0906 Last data filed at 01/29/16 0826  Gross per 24 hour  Intake             1080 ml  Output             1430 ml  Net             -350 ml    General: Alert, awake, in no acute distress Neck:  JVP is difficult to assess Pt sitting   Heart: Irregular rate and rhythm, without murmurs, rubs, gallops.  Lungs: Rales bilaterally   Exemities:  No edema.   Neuro: Grossly intact, nonfocal.  Tele  Afib 90s to 100s   Lab Results: No results found for this or any previous visit (from the past 24 hour(s)).  Studies/Results: Dg Shoulder Left  Result Date: 01/28/2016 CLINICAL DATA:  Posterior left shoulder pain since yesterday evening. Limited mobility due to pain. No injury. Initial encounter. EXAM: LEFT SHOULDER - 2+ VIEW COMPARISON:  None. FINDINGS: Two views study shows no fracture. Humeral head appears located. Acromioclavicular and coracoclavicular distances are preserved. Degenerative changes are noted at the [facility] joint. No worrisome lytic or sclerotic osseous abnormality. IMPRESSION: Degenerative change at [facility] joint.  Otherwise unremarkable. Electronically Signed   By: Kennith CenterEric  Mansell M.D.   On: 01/28/2016 17:41     Medications: Reviewed     1  Acute systolic CHF   EF is depressed compared to previous studies  metoprolol was decreased to 50 mg a day (changed from metoprolol 25 TID to toprol XL 50 mg a day )  HR is better.   Breathing better - getting his 5th dialysis session Continue current meds.  2  Afib  As above  Continue amio  ( will decrease dose to 200 mg a day ) and Eliquis  3  ESRD  REceiving dialysis   4  LBBB  Old    Follow up with his cardiologist at home   LOS: 6 days   Kristeen Misshilip Nahser 01/29/2016, 9:06 AM

## 2016-01-29 NOTE — Care Management (Addendum)
CM discussed patient being medically cleared to return to SNF with Dr. Sharl MaLama.. CSW consulted concernng return to SNF.

## 2016-01-30 NOTE — Progress Notes (Signed)
Jamie Brock to be D/C'd Skilled nursing facility per MD order.  Discussed prescriptions and follow up appointments with the patient. Prescriptions given to patient, medication list explained in detail. Pt verbalized understanding.    Medication List    STOP taking these medications   metoprolol tartrate 25 MG tablet Commonly known as:  LOPRESSOR   sodium phosphate 7-19 GM/118ML Enem     TAKE these medications   acetaminophen 325 MG tablet Commonly known as:  TYLENOL Take 650 mg by mouth every 6 (six) hours as needed for fever (pain).   amiodarone 200 MG tablet Commonly known as:  PACERONE Take 1 tablet (200 mg total) by mouth daily. What changed:  medication strength  how much to take   atorvastatin 10 MG tablet Commonly known as:  LIPITOR Take 10 mg by mouth daily.   clonazePAM 0.5 MG tablet Commonly known as:  KLONOPIN Take 0.5 tablets (0.25 mg total) by mouth at bedtime.   ELIQUIS 2.5 MG Tabs tablet Generic drug:  apixaban Take 2.5 mg by mouth 2 (two) times daily.   ferrous sulfate 325 (65 FE) MG tablet Take 325 mg by mouth daily.   ipratropium-albuterol 0.5-2.5 (3) MG/3ML Soln Commonly known as:  DUONEB Take 3 mLs by nebulization every 6 (six) hours.   magnesium hydroxide 400 MG/5ML suspension Commonly known as:  MILK OF MAGNESIA Take 30 mLs by mouth once as needed (constipation (if no BM on day 3)).   metoprolol succinate 50 MG 24 hr tablet Commonly known as:  TOPROL-XL Take 1 tablet (50 mg total) by mouth daily. Take with or immediately following a meal.   mirtazapine 30 MG tablet Commonly known as:  REMERON Take 30 mg by mouth at bedtime.   tamsulosin 0.4 MG Caps capsule Commonly known as:  FLOMAX Take 0.4 mg by mouth at bedtime.       Vitals:   01/30/16 0500 01/30/16 0950  BP: 94/62 (!) 86/55  Pulse: (!) 105 (!) 105  Resp: 19   Temp: 98.2 F (36.8 C) 97.5 F (36.4 C)    Skin clean, dry and intact without evidence of skin break down, no  evidence of skin tears noted. IV catheter discontinued intact. Site without signs and symptoms of complications. Dressing and pressure applied. Pt denies pain at this time. No complaints noted.  An After Visit Summary was printed and given to the patient. Patient escorted via stretcher, and D/C home via ambulance.  Janeann ForehandLuke Kataya Guimont BSN, RN

## 2016-01-30 NOTE — Clinical Social Work Note (Addendum)
Patient discharging [facility] ([address]) today via [facility]. Discharge clinicals transmitted to facility and admissions director Wyline BeadyJessica Hamilton indicated that they are ready to receive patient today. Patient's sister, Vella RaringLaura Brady [phone]) contacted and message left regarding patient discharge.   Genelle BalVanessa Chessa Barrasso, MSW, LCSW Licensed Clinical Social Worker Clinical Social Work Department [facility] [phone]

## 2016-01-30 NOTE — Progress Notes (Signed)
Physical Therapy Treatment Patient Details Name: Makoto Sellitto MRN: [id number] DOB: 19-Oct-1929 Today's Date: 01/30/2016    History of Present Illness Patient is an 82 yo male admitted 01/23/16 with AMS, cough, SOB.  Patient with Afib, metabolic encephalopathy, hypoxic resp failure, hypotension.    PMH:  Dementia, ESRD on HD, Afib, HLD    PT Comments    Able to take steps with 2 person assist and chair pulled behind to boost pt confidence while working on functional mobility; Mr. Helbing seemed pleased with his progress  Follow Up Recommendations  SNF;Supervision/Assistance - 24 hour     Equipment Recommendations  Rolling walker with 5" wheels;3in1 (PT)    Recommendations for Other Services       Precautions / Restrictions Precautions Precautions: Fall Restrictions Weight Bearing Restrictions: No    Mobility  Bed Mobility Overal bed mobility: Needs Assistance Bed Mobility: Supine to Sit     Supine to sit: Mod assist     General bed mobility comments: Verbal cues for technique.  Assist to raise trunk to sitting position.  Once upright, able to maintain static sitting.    Transfers Overall transfer level: Needs assistance Equipment used: Rolling walker (2 wheeled) Transfers: Sit to/from Stand Sit to Stand: Mod assist;+2 physical assistance         General transfer comment: Cues for initiation, hand placement and safety; Mod assist to rise; Tends to have trunk flexed and heavy dependence on UE suppport on RW  Ambulation/Gait Ambulation/Gait assistance: +2 physical assistance;+2 safety/equipment;Mod assist Ambulation Distance (Feet): 6 Feet Assistive device: Rolling walker (2 wheeled) Gait Pattern/deviations: Shuffle;Trunk flexed;Decreased step length - right;Decreased step length - left     General Gait Details: Mod assist for RW manageemnt; tending to keep RW too far ahead; second person pushing chair behind; cues to self-monitor for activity tolerance   Stairs            Wheelchair Mobility    Modified Rankin (Stroke Patients Only)       Balance     Sitting balance-Leahy Scale: Fair       Standing balance-Leahy Scale: Poor                      Cognition Arousal/Alertness: Awake/alert Behavior During Therapy: WFL for tasks assessed/performed;Flat affect Overall Cognitive Status: No family/caregiver present to determine baseline cognitive functioning                      Exercises      General Comments General comments (skin integrity, edema, etc.): Opted to walk without O2 as Mr. Griep told me he is not normally on supplemental O2 at baseline; O2 sats 89% after walking on Room Air; restarted supplemental O2 and sats incr to greater than  or equal to 94%      Pertinent Vitals/Pain Pain Assessment: No/denies pain    Home Living                      Prior Function            PT Goals (current goals can now be found in the care plan section) Acute Rehab PT Goals Patient Stated Goal: None stated PT Goal Formulation: With patient Time For Goal Achievement: 02/09/16 Potential to Achieve Goals: Good Progress towards PT goals: Progressing toward goals    Frequency  Min 2X/week    PT Plan Current plan remains appropriate    Co-evaluation  End of Session Equipment Utilized During Treatment: Gait belt;Oxygen Activity Tolerance: Patient tolerated treatment well Patient left: in chair;with call bell/phone within reach;with chair alarm set     Time: [id number] PT Time Calculation (min) (ACUTE ONLY): 18 min  Charges:  $Gait Training: 8-22 mins                    G Codes:      Van ClinesGarrigan, Anderson Middlebrooks Hamff 01/30/2016, 11:02 AM   Van ClinesHolly Demetrus Pavao, PT  Acute Rehabilitation Services Pager [phone] Office [phone]

## 2016-01-30 NOTE — Progress Notes (Signed)
[facility]  PROGRESS NOTE  Jamie Brock EXB:[id number] DOB: 09/21/1929 DOA: 01/23/2016 PCP: No primary care provider on file.    Brief HPI:  82 y/o ? [facility] resident P Afib/fluter CHAD2Vasc2~5 on eliquis and controlled with Amiodarone Htn Diastolic CHF presumed as no current echo on file New ESRD since 8/22 on dialysis [name] to have hypoxia and transported to [facility] given concerns for HCAP and  Hypoxia from [facility]     Assessment/Plan:    1. Metabolic encephalopathy- resolved, secondary to underlying mild dementia, hypoxia from pulmonary edema. 2. Acute hypoxemic respiratory failure- resolved after hemodialysis, patient presented with O2 sats of 80%, ABG showed pH 7.44, PO2 49.3, chest x-ray showed interstitial edema and small pleural effusions ,no pneumonia. Antibiotics were empirically started and currently discontinued as no evidence of pneumonia. 3. ESRD on hemodialysis- followed by nephrology, started on Tuesday Thursday and Saturday schedule. 4. Atrial fibrillation/flutter- CHA2DS2VASc score is 5, continue metoprolol 25 mg 2 times a day., Amiodarone, Apixaban. Heart rate is controlled. Appreciate cardiology recommendations. 5. Insomnia- patient has been having difficulty sleeping at night for past two nights, start Clonazepam 0.25 mg po q hs. 6. Hyperlipidemia- continue atorvastatin 10 mg by mouth daily 7. Urinary retention- continue Flomax 0.4 mg daily   DVT prophylaxis: Apixaban Code Status: Full code Family Communication: No family at bedside  Disposition Plan: Skilled  facility   Consultants:  None   Procedures:  None   Antibiotics:  Cefepime 9/4- 9/5  Vancomycin 9/4- 9/5  Subjective   Patient seen and examined, denies chest pain or shortness of breath.  Objective    Objective: Vitals:   01/29/16 2019 01/30/16 0500 01/30/16 0950 01/30/16 1004  BP: 98/67 94/62 (!) 86/55   Pulse: (!) 110 (!) 105 (!) 105   Resp: 18 19     Temp:  98.2 F (36.8 C) 97.5 F (36.4 C)   TempSrc:  Axillary Oral   SpO2: 94% 94% 100% 97%  Weight:      Height:        Intake/Output Summary (Last 24 hours) at 01/30/16 1037 Last data filed at 01/30/16 0955  Gross per 24 hour  Intake              120 ml  Output              630 ml  Net             -510 ml   Filed Weights   01/28/16 2037 01/29/16 0857 01/29/16 1157  Weight: 77.1 kg (169 lb 15.6 oz) 74.9 kg (165 lb 2 oz) 73.4 kg (161 lb 13.1 oz)    Examination:  General exam: Appears calm and comfortable  Respiratory system: Clear to auscultation. Respiratory effort normal. Cardiovascular system: S1 & S2 heard, RRR. No JVD, murmurs, rubs, gallops or clicks. No pedal edema. Gastrointestinal system: Abdomen is nondistended, soft and nontender. No organomegaly or masses felt. Normal bowel sounds heard. Central nervous system: Alert and oriented. No focal neurological deficits. Extremities: Symmetric 5 x 5 power. Skin: No rashes, lesions or ulcers Psychiatry: Judgement and insight appear normal. Mood & affect appropriate.    Data Reviewed: I have personally reviewed following labs and imaging studies Basic Metabolic Panel:  Recent Labs Lab 01/23/16 2311  01/25/16 0410 01/26/16 0415 01/27/16 0429 01/28/16 0314 01/29/16 0830  NA 137  < > 137 140 138 142 137  K 5.0  < > 4.3 4.6 3.6 3.7 4.1  CL  108  < > 98* 105 103 107 102  CO2 26  < > 26 22 27 26 27   GLUCOSE 110*  < > 98 89 93 115* 115*  BUN 38*  < > 27* 40* 29* 43* 21*  CREATININE 4.04*  < > 3.11* 3.56* 3.00* 4.06* 2.86*  CALCIUM 8.4*  < > 8.2* 8.5* 8.3* 8.2* 8.7*  PHOS 2.4*  --  2.7 4.2  --   --  2.6  < > = values in this interval not displayed. Liver Function Tests:  Recent Labs Lab 01/23/16 2311 01/25/16 0410 01/26/16 0415 01/29/16 0830  ALBUMIN 2.6* 2.8* 2.7* 2.7*   No results for input(s): LIPASE, AMYLASE in the last 168 hours. No results for input(s): AMMONIA in the last 168 hours. CBC:  Recent  Labs Lab 01/24/16 0458 01/25/16 0410 01/26/16 0416 01/28/16 0737 01/29/16 0830  WBC 7.7 7.6 8.1 7.5 8.3  HGB 9.1* 9.4* 9.7* 8.9* 9.2*  HCT 29.3* 30.9* 30.7* 28.8* 30.0*  MCV 101.7* 102.7* 99.0 100.7* 101.7*  PLT 177 194 158 190 181   Cardiac Enzymes:  Recent Labs Lab 01/23/16 1811  TROPONINI 0.03*   BNP (last 3 results) No results for input(s): BNP in the last 8760 hours.  ProBNP (last 3 results) No results for input(s): PROBNP in the last 8760 hours.  CBG:  Recent Labs Lab 01/25/16 1659  GLUCAP 139*    Recent Results (from the past 240 hour(s))  Culture, blood (routine x 2) Call MD if unable to obtain prior to antibiotics being given     Status: None   Collection Time: 01/23/16  6:18 PM  Result Value Ref Range Status   Specimen Description BLOOD RIGHT ANTECUBITAL  Final   Special Requests BOTTLES DRAWN AEROBIC AND ANAEROBIC 5CC  Final   Culture NO GROWTH 5 DAYS  Final   Report Status 01/28/2016 FINAL  Final  Culture, blood (routine x 2) Call MD if unable to obtain prior to antibiotics being given     Status: None   Collection Time: 01/23/16  6:30 PM  Result Value Ref Range Status   Specimen Description BLOOD LEFT HAND  Final   Special Requests BOTTLES DRAWN AEROBIC ONLY 5CC  Final   Culture NO GROWTH 5 DAYS  Final   Report Status 01/28/2016 FINAL  Final  MRSA PCR Screening     Status: None   Collection Time: 01/24/16  3:31 AM  Result Value Ref Range Status   MRSA by PCR NEGATIVE NEGATIVE Final    Comment:        The GeneXpert MRSA Assay (FDA approved for NASAL specimens only), is one component of a comprehensive MRSA colonization surveillance program. It is not intended to diagnose MRSA infection nor to guide or monitor treatment for MRSA infections.      Studies: Dg Shoulder Left  Result Date: 01/28/2016 CLINICAL DATA:  Posterior left shoulder pain since yesterday evening. Limited mobility due to pain. No injury. Initial encounter. EXAM: LEFT  SHOULDER - 2+ VIEW COMPARISON:  None. FINDINGS: Two views study shows no fracture. Humeral head appears located. Acromioclavicular and coracoclavicular distances are preserved. Degenerative changes are noted at the [facility] joint. No worrisome lytic or sclerotic osseous abnormality. IMPRESSION: Degenerative change at [facility] joint.  Otherwise unremarkable. Electronically Signed   By: Kennith CenterEric  Mansell M.D.   On: 01/28/2016 17:41    Scheduled Meds: . amiodarone  200 mg Oral Daily  . apixaban  2.5 mg Oral BID  . atorvastatin  10  mg Oral q1800  . clonazePAM  0.25 mg Oral QHS  . darbepoetin (ARANESP) injection - DIALYSIS  40 mcg Intravenous Q Sat-HD  . ferrous sulfate  325 mg Oral Daily  . ipratropium-albuterol  3 mL Nebulization TID  . metoprolol succinate  50 mg Oral Daily  . mirtazapine  30 mg Oral QHS  . tamsulosin  0.4 mg Oral QHS   Continuous Infusions:      Time spent: 25 min    [facility]  [facility] Pager [phone]. If 7PM-7AM, please contact night-coverage at [email or web address], Office  [phone]  password [email or web address] 01/30/2016, 10:37 AM  LOS: 7 days

## 2017-05-28 IMAGING — CR DG CHEST 1V
1 series · 1 of 1 positions shown · non-contrast
Comparison: Portable chest x-ray January 23, 2016

CLINICAL DATA: Acute respiratory failure, onset of nausea and
vomiting this morning. History of atrial fibrillation, CHF, chronic
renal insufficiency on dialysis

EXAM:
CHEST 1 VIEW

[chest ap]
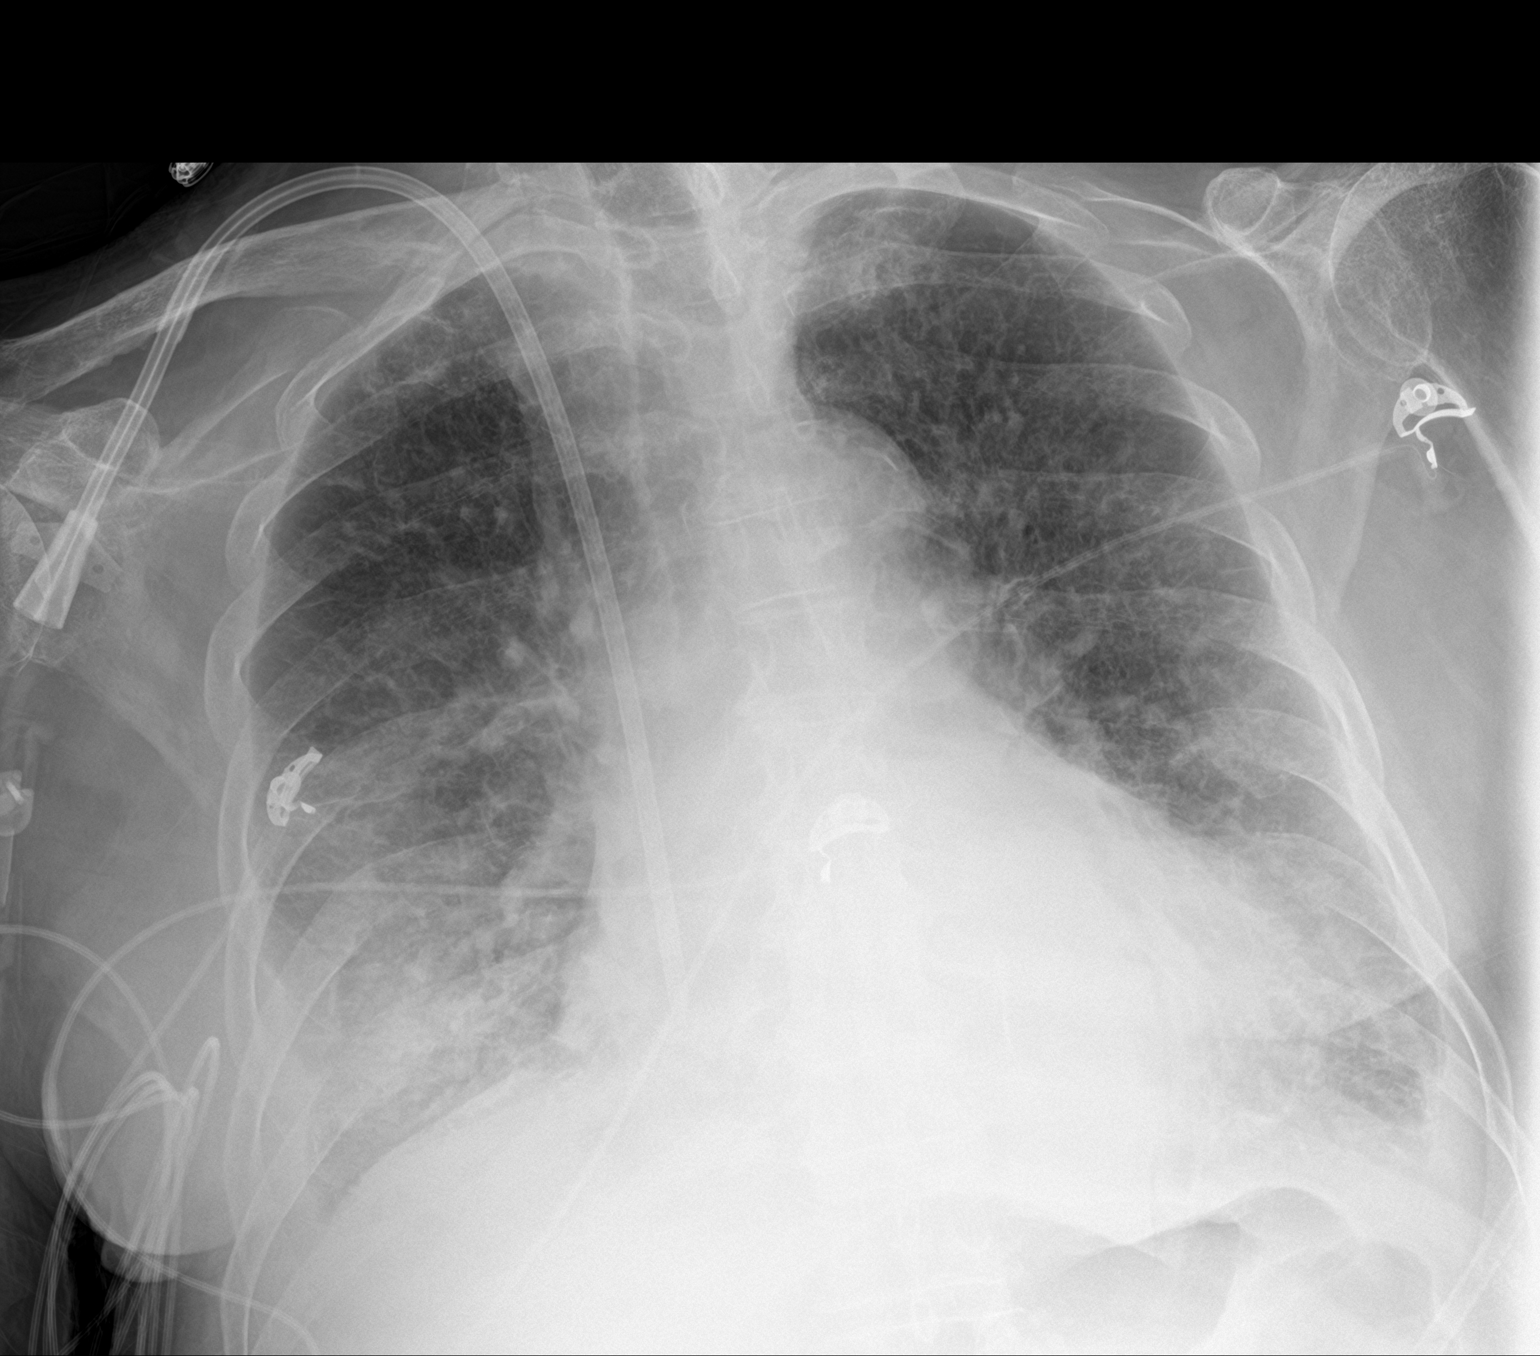

[1 of 1 positions shown; findings below may reference images not displayed]

FINDINGS: The lungs are well-expanded. The interstitial markings remain
increased. There small bilateral pleural effusions. The cardiac
silhouette remains enlarged. The pulmonary vascularity remains
engorged. There is calcification in the wall of the aortic arch. The
dual-lumen dialysis catheter tip projects at the cavoatrial
junction.
IMPRESSION: Fairly stable appearance of the chest consistent with interstitial
edema and small pleural effusions likely secondary to CHF. No
discrete pneumonia.

Aortic atherosclerosis.

## 2018-06-21 DEATH — deceased
